# Patient Record
Sex: Female | Born: 1985 | Race: Black or African American | Hispanic: No | Marital: Single | Smoking: Never smoker
Health system: Southern US, Community
[De-identification: ages and names within clinical notes are randomized; demographics above are authoritative.]

## PROBLEM LIST (undated history)

## (undated) DIAGNOSIS — G039 Meningitis, unspecified: Secondary | ICD-10-CM

## (undated) DIAGNOSIS — R569 Unspecified convulsions: Secondary | ICD-10-CM

## (undated) DIAGNOSIS — Z87898 Personal history of other specified conditions: Secondary | ICD-10-CM

## (undated) HISTORY — DX: Personal history of other specified conditions: Z87.898

## (undated) HISTORY — DX: Meningitis, unspecified: G03.9

## (undated) HISTORY — DX: Unspecified convulsions: R56.9

---

## 2013-12-22 ENCOUNTER — Encounter: Payer: Self-pay | Admitting: Neurology

## 2013-12-22 ENCOUNTER — Ambulatory Visit (INDEPENDENT_AMBULATORY_CARE_PROVIDER_SITE_OTHER): Payer: BC Managed Care – PPO | Admitting: Neurology

## 2013-12-22 VITALS — BP 112/74 | HR 75 | Ht 66.0 in | Wt 237.0 lb

## 2013-12-22 DIAGNOSIS — G43909 Migraine, unspecified, not intractable, without status migrainosus: Secondary | ICD-10-CM

## 2013-12-22 DIAGNOSIS — R404 Transient alteration of awareness: Secondary | ICD-10-CM

## 2013-12-22 MED ORDER — RIZATRIPTAN BENZOATE 5 MG PO TBDP: 5.0000 mg | ORAL_TABLET | ORAL | Status: DC | PRN

## 2013-12-22 MED ORDER — TOPIRAMATE 50 MG PO TABS: ORAL_TABLET | ORAL | Status: DC

## 2013-12-22 NOTE — Progress Notes (Signed)
PATIENT: Jessica Rasmussen DOB: [birth year]  HISTORICAL  Jessica Rasmussen is a 28 years old female, referred by her primary care physician Dr. Hyman HopesWebb for evaluation of frequent headaches, and passing out episode.  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  She began to have frequent headaches since high school, only happen occasionally at the beginning, since Summer 2014, she began to have 1-2 headache each week, aleve helps some, her headache usually lasts few hours.    She also has history of occasional passing out, couple times each year, usually happened in sitting position, proceeded by light headness, feeling going to pass out.   Few days ago, she has one episode of passing out, witnessed by her family, proceeded by feeling dizziness, then eyes rolled backwards, seizure like activities, loss of consciousness for few minutes.   REVIEW OF SYSTEMS: Full 14 system review of systems performed and notable only for many years, spinning sensation, headaches, weakness, dizziness, seizure, passing out  ALLERGIES: Not on File  HOME MEDICATIONS: No current outpatient prescriptions on file prior to visit.   No current facility-administered medications on file prior to visit.    PAST MEDICAL HISTORY: Past Medical History  Diagnosis Date  . Meningitis   . H/O syncope   . Seizure     PAST SURGICAL HISTORY: No past surgical history on file.  FAMILY HISTORY: Family History  Problem Relation Age of Onset  . Diabetes Mother   . Diabetes Maternal Aunt   . Diabetes Maternal Grandmother   . Hypertension Mother   . Hypertension Maternal Grandmother     SOCIAL HISTORY:  History   Social History  . Marital Status: Single    Spouse Name: N/A    Number of Children: 2  . Years of Education: HS    Occupational History  .  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    Social History Main Topics  . Smoking status: Never Smoker   . Smokeless tobacco: Never Used  . Alcohol Use: No  . Drug Use: No  . Sexual Activity: Not on file   Other Topics Concern  . Not on file   Social History Narrative   Patient is single.   Patient has two children.   Patient has a high school education.   Patient works full-time.   Patient does not  drink caffeine.   Patient is right-handed.     PHYSICAL EXAM   Filed Vitals:   12/22/13 1605  BP: 112/74  Pulse: 75  Height: 5\' 6"  (1.676 m)  Weight: 237 lb (107.502 kg)    Not recorded    Body mass index is 38.27 kg/(m^2).   Generalized: In no acute distress  Neck: Supple, no carotid bruits   Cardiac: Regular rate rhythm  Pulmonary: Clear to auscultation bilaterally  Musculoskeletal: No deformity  Neurological examination  Mentation: Alert oriented to time, place, history taking, and causual conversation  Cranial nerve II-XII: Pupils were equal round reactive to light. Extraocular movements were full.  Visual field were full on confrontational test. Bilateral fundi were sharp.  Facial sensation and strength were normal. Hearing was intact to finger rubbing bilaterally. Uvula tongue midline.  Head turning and shoulder shrug and were normal and symmetric.Tongue protrusion into cheek strength was normal.  Motor: Normal tone, bulk and strength.  Sensory: Intact to  fine touch, pinprick, preserved vibratory sensation, and proprioception at toes.  Coordination: Normal finger to nose, heel-to-shin bilaterally there was no truncal ataxia  Gait: Rising up from seated position without assistance, normal stance, without trunk ataxia, moderate stride, good arm swing, smooth turning, able to perform tiptoe, and heel walking without difficulty.   Romberg signs: Negative  Deep tendon reflexes: Brachioradialis 2/2, biceps 2/2, triceps 2/2,  patellar 2/2, Achilles 2/2, plantar responses were flexor bilaterally.   DIAGNOSTIC DATA (LABS, IMAGING, TESTING) - I reviewed patient records, labs, notes, testing and imaging myself where available.  ASSESSMENT AND PLAN  Jessica Rasmussen is a 28 y.o. female with past medical history of meningitis, seizure, now presenting with seizure like activity, history of migraines, presenting with small frequent headaches, normal neurological examinations  1. her acute MRI of the brain without contrast, EEG 2. starting Topamax, titrating to 50 mg 2 tablets twice a day 3.  Maxalt as needed for migraine headaches   Levert Feinstein, M.D. Ph.D.  [facility] [address], [address] [place], [place] [address] [phone]

## 2014-01-04 ENCOUNTER — Other Ambulatory Visit: Payer: BC Managed Care – PPO

## 2014-01-04 ENCOUNTER — Other Ambulatory Visit: Payer: BC Managed Care – PPO | Admitting: Radiology

## 2014-02-17 ENCOUNTER — Other Ambulatory Visit: Payer: Self-pay | Admitting: Obstetrics and Gynecology

## 2014-02-17 ENCOUNTER — Other Ambulatory Visit (HOSPITAL_COMMUNITY)
Admission: RE | Admit: 2014-02-17 | Discharge: 2014-02-17 | Disposition: A | Discharge status: Home / Self Care | Payer: BC Managed Care – PPO | Source: Ambulatory Visit | Attending: Obstetrics and Gynecology | Admitting: Obstetrics and Gynecology

## 2014-02-17 DIAGNOSIS — Z01419 Encounter for gynecological examination (general) (routine) without abnormal findings: Secondary | ICD-10-CM | POA: Insufficient documentation

## 2014-03-24 ENCOUNTER — Ambulatory Visit: Payer: BC Managed Care – PPO | Admitting: Neurology

## 2014-06-12 ENCOUNTER — Emergency Department (HOSPITAL_COMMUNITY)
Admission: EM | Admit: 2014-06-12 | Discharge: 2014-06-12 | Disposition: A | Discharge status: Home / Self Care | Payer: BC Managed Care – PPO | Attending: Emergency Medicine | Admitting: Emergency Medicine

## 2014-06-12 ENCOUNTER — Encounter (HOSPITAL_COMMUNITY): Payer: Self-pay | Admitting: Emergency Medicine

## 2014-06-12 DIAGNOSIS — D649 Anemia, unspecified: Secondary | ICD-10-CM | POA: Diagnosis not present

## 2014-06-12 DIAGNOSIS — R51 Headache: Secondary | ICD-10-CM | POA: Insufficient documentation

## 2014-06-12 DIAGNOSIS — R110 Nausea: Secondary | ICD-10-CM | POA: Diagnosis not present

## 2014-06-12 DIAGNOSIS — Z9189 Other specified personal risk factors, not elsewhere classified: Secondary | ICD-10-CM | POA: Diagnosis not present

## 2014-06-12 DIAGNOSIS — Z3202 Encounter for pregnancy test, result negative: Secondary | ICD-10-CM | POA: Insufficient documentation

## 2014-06-12 DIAGNOSIS — G43909 Migraine, unspecified, not intractable, without status migrainosus: Secondary | ICD-10-CM | POA: Insufficient documentation

## 2014-06-12 DIAGNOSIS — Z8661 Personal history of infections of the central nervous system: Secondary | ICD-10-CM | POA: Insufficient documentation

## 2014-06-12 DIAGNOSIS — G43009 Migraine without aura, not intractable, without status migrainosus: Secondary | ICD-10-CM

## 2014-06-12 LAB — BASIC METABOLIC PANEL
ANION GAP: 11 (ref 5–15)
BUN: 9 mg/dL (ref 6–23)
CO2: 23 mEq/L (ref 19–32)
Calcium: 9.1 mg/dL (ref 8.4–10.5)
Chloride: 104 mEq/L (ref 96–112)
Creatinine, Ser: 0.66 mg/dL (ref 0.50–1.10)
Glucose, Bld: 75 mg/dL (ref 70–99)
POTASSIUM: 4 meq/L (ref 3.7–5.3)
Sodium: 138 mEq/L (ref 137–147)

## 2014-06-12 LAB — CBC WITH DIFFERENTIAL/PLATELET
BASOS ABS: 0 10*3/uL (ref 0.0–0.1)
BASOS PCT: 1 % (ref 0–1)
EOS ABS: 0.1 10*3/uL (ref 0.0–0.7)
Eosinophils Relative: 1 % (ref 0–5)
HEMATOCRIT: 36.3 % (ref 36.0–46.0)
HEMOGLOBIN: 11.9 g/dL — AB (ref 12.0–15.0)
LYMPHS PCT: 54 % — AB (ref 12–46)
Lymphs Abs: 2.8 10*3/uL (ref 0.7–4.0)
MCH: 26.7 pg (ref 26.0–34.0)
MCHC: 32.8 g/dL (ref 30.0–36.0)
MCV: 81.4 fL (ref 78.0–100.0)
Monocytes Absolute: 0.4 10*3/uL (ref 0.1–1.0)
Monocytes Relative: 7 % (ref 3–12)
Neutro Abs: 1.9 10*3/uL (ref 1.7–7.7)
Neutrophils Relative %: 37 % — ABNORMAL LOW (ref 43–77)
Platelets: 316 10*3/uL (ref 150–400)
RBC: 4.46 MIL/uL (ref 3.87–5.11)
RDW: 12.6 % (ref 11.5–15.5)
WBC: 5.1 10*3/uL (ref 4.0–10.5)

## 2014-06-12 LAB — POC URINE PREG, ED: Preg Test, Ur: NEGATIVE

## 2014-06-12 MED ORDER — KETOROLAC TROMETHAMINE 30 MG/ML IJ SOLN
30.0000 mg | Freq: Once | INTRAMUSCULAR | Status: AC
  Administered 2014-06-12: 30 mg via INTRAVENOUS
  Filled 2014-06-12: qty 1

## 2014-06-12 MED ORDER — NAPROXEN 500 MG PO TABS: 500.0000 mg | ORAL_TABLET | Freq: Two times a day (BID) | ORAL | Status: DC | PRN

## 2014-06-12 MED ORDER — HYDROCODONE-ACETAMINOPHEN 5-325 MG PO TABS: 1.0000 | ORAL_TABLET | Freq: Four times a day (QID) | ORAL | Status: DC | PRN

## 2014-06-12 MED ORDER — METOCLOPRAMIDE HCL 10 MG PO TABS: 10.0000 mg | ORAL_TABLET | Freq: Four times a day (QID) | ORAL | Status: DC | PRN

## 2014-06-12 MED ORDER — SODIUM CHLORIDE 0.9 % IV BOLUS (SEPSIS)
1000.0000 mL | Freq: Once | INTRAVENOUS | Status: AC
  Administered 2014-06-12: 1000 mL via INTRAVENOUS

## 2014-06-12 MED ORDER — DIPHENHYDRAMINE HCL 25 MG PO TABS: 25.0000 mg | ORAL_TABLET | Freq: Four times a day (QID) | ORAL | Status: DC | PRN

## 2014-06-12 MED ORDER — DEXAMETHASONE SODIUM PHOSPHATE 10 MG/ML IJ SOLN
15.0000 mg | Freq: Once | INTRAMUSCULAR | Status: AC
  Administered 2014-06-12: 15 mg via INTRAVENOUS
  Filled 2014-06-12: qty 2

## 2014-06-12 NOTE — Discharge Instructions (Signed)
Your headache could be a migraine which did not respond to your home Topamax, but did well with Toradol and fluids here. Use naprosyn and norco for pain, but don't drive or operate machinery while taking this medication. Use reglan as directed, taken with benadryl, as needed for headache or nausea. Be aware this medication may make you drowsy as well. Stay very well hydrated, this is important to helping your headaches. Your lab work shows some mild anemia, you'll need to see your regular doctor for this. You need to follow up with neurology for ongoing management and care, see them within one week. If your symptoms worsen or change, return to the ER.    Migraine Headache A migraine headache is very bad, throbbing pain on one or both sides of your head. Talk to your doctor about what things may bring on (trigger) your migraine headaches. HOME CARE  Only take medicines as told by your doctor.  Lie down in a dark, quiet room when you have a migraine.  Keep a journal to find out if certain things bring on migraine headaches. For example, write down:  What you eat and drink.  How much sleep you get.  Any change to your diet or medicines.  Lessen how much alcohol you drink.  Quit smoking if you smoke.  Get enough sleep.  Lessen any stress in your life.  Keep lights dim if bright lights bother you or make your migraines worse. GET HELP RIGHT AWAY IF:   Your migraine becomes really bad.  You have a fever.  You have a stiff neck.  You have trouble seeing.  Your muscles are weak, or you lose muscle control.  You lose your balance or have trouble walking.  You feel like you will pass out (faint), or you pass out.  You have really bad symptoms that are different than your first symptoms. MAKE SURE YOU:   Understand these instructions.  Will watch your condition.  Will get help right away if you are not doing well or get worse. Document Released: 07/15/2008 Document Revised:  12/29/2011 Document Reviewed: 06/13/2013 [facility] Patient Information 2015 [place], [place]. This information is not intended to replace advice given to you by your health care provider. Make sure you discuss any questions you have with your health care provider.  Anemia, Nonspecific Anemia is a condition in which the concentration of red blood cells or hemoglobin in the blood is below normal. Hemoglobin is a substance in red blood cells that carries oxygen to the tissues of the body. Anemia results in not enough oxygen reaching these tissues.  CAUSES  Common causes of anemia include:   Excessive bleeding. Bleeding may be internal or external. This includes excessive bleeding from periods (in women) or from the intestine.   Poor nutrition.   Chronic kidney, thyroid, and liver disease.  Bone marrow disorders that decrease red blood cell production.  Cancer and treatments for cancer.  HIV, AIDS, and their treatments.  Spleen problems that increase red blood cell destruction.  Blood disorders.  Excess destruction of red blood cells due to infection, medicines, and autoimmune disorders. SIGNS AND SYMPTOMS   Minor weakness.   Dizziness.   Headache.  Palpitations.   Shortness of breath, especially with exercise.   Paleness.  Cold sensitivity.  Indigestion.  Nausea.  Difficulty sleeping.  Difficulty concentrating. Symptoms may occur suddenly or they may develop slowly.  DIAGNOSIS  Additional blood tests are often needed. These help your health care provider determine the  best treatment. Your health care provider will check your stool for blood and look for other causes of blood loss.  TREATMENT  Treatment varies depending on the cause of the anemia. Treatment can include:   Supplements of iron, vitamin B12, or folic acid.   Hormone medicines.   A blood transfusion. This may be needed if blood loss is severe.   Hospitalization. This may be needed if there  is significant continual blood loss.   Dietary changes.  Spleen removal. HOME CARE INSTRUCTIONS Keep all follow-up appointments. It often takes many weeks to correct anemia, and having your health care provider check on your condition and your response to treatment is very important. SEEK IMMEDIATE MEDICAL CARE IF:   You develop extreme weakness, shortness of breath, or chest pain.   You become dizzy or have trouble concentrating.  You develop heavy vaginal bleeding.   You develop a rash.   You have bloody or black, tarry stools.   You faint.   You vomit up blood.   You vomit repeatedly.   You have abdominal pain.  You have a fever or persistent symptoms for more than 2-3 days.   You have a fever and your symptoms suddenly get worse.   You are dehydrated.  MAKE SURE YOU:  Understand these instructions.  Will watch your condition.  Will get help right away if you are not doing well or get worse. Document Released: 11/13/2004 Document Revised: 06/08/2013 Document Reviewed: 04/01/2013 [facility] Patient Information 2015 [place], [place]. This information is not intended to replace advice given to you by your health care provider. Make sure you discuss any questions you have with your health care provider.

## 2014-06-12 NOTE — ED Notes (Signed)
Pt reports generalized HA x "months" but states this AM it was so bad it awoke her from sleep. Reports photophobia. Denies vision changes/blurred vision. Denies taking anything for pain. NAD. AO x4. PERRLA, 3mm.

## 2014-06-12 NOTE — ED Provider Notes (Signed)
CSN: [id number]     Arrival date & time 06/12/14  1152 History   First MD Initiated Contact with Patient 06/12/14 1435     Chief Complaint  Patient presents with  . Headache     (Consider location/radiation/quality/duration/timing/severity/associated sxs/prior Treatment) HPI Comments: Jessica Rasmussen is a 28 y.o. female with a PMHx of migraines, remote hx of meningitis as a child (does not recall), and remote hx of seizures, who presents to the ED today with complaints of ongoing migraine x 1 week which became unbearable this morning. States it's generalized, constant, nonradiating, 10/10, pressure-like, worse with leaning forward and coughing/sneezing, and unrelieved with ibuprofen and topamax. States the pain is similar to her migraines, although usually her migraines resolve with medications. Denies thunderclap onset or worst headache of her life. Endorses photophobia, as well as some mild nausea yesterday which is now resolved. Endorses feeling lightheaded when going from sitting to standing. Denies fever, chills, vertigo, syncope, neck pain, neck stiffness, URI symptoms, CP, SOB, cough, abd pain, vomiting, diarrhea/constipation, urinary symptoms, vaginal symptoms, paresthesias, weakness, numbness, facial asymmetry, confusion, vision changes/blurry vision. Denies recent insect bites/tick bites or recent travel. States she doesn't recall if this feels at all like her meningitis because she was very young, but doesn't think this is as bad as that was.   Patient is a 28 y.o. female presenting with migraines. The history is provided by the patient. No language interpreter was used.  Migraine This is a chronic problem. The current episode started in the past 7 days. The problem occurs constantly. The problem has been unchanged. Associated symptoms include headaches and nausea (once yesterday, now resolved). Pertinent negatives include no abdominal pain, arthralgias, change in bowel habit, chest pain,  chills, congestion, coughing, fatigue, fever, joint swelling, myalgias, neck pain, numbness, rash, sore throat, urinary symptoms, vertigo, visual change, vomiting or weakness. The symptoms are aggravated by bending, coughing and exertion. She has tried NSAIDs (Ibuprofen and topamax) for the symptoms. The treatment provided no relief.    Past Medical History  Diagnosis Date  . Meningitis   . H/O syncope   . Seizure    History reviewed. No pertinent past surgical history. Family History  Problem Relation Age of Onset  . Diabetes Mother   . Diabetes Maternal Aunt   . Diabetes Maternal Grandmother   . Hypertension Mother   . Hypertension Maternal Grandmother    History  Substance Use Topics  . Smoking status: Never Smoker   . Smokeless tobacco: Never Used  . Alcohol Use: No   OB History   Grav Para Term Preterm Abortions TAB SAB Ect Mult Living                 Review of Systems  Constitutional: Negative for fever, chills and fatigue.  HENT: Negative for congestion and sore throat.   Eyes: Positive for photophobia. Negative for pain and visual disturbance.  Respiratory: Negative for cough and chest tightness.   Cardiovascular: Negative for chest pain and leg swelling.  Gastrointestinal: Positive for nausea (once yesterday, now resolved). Negative for vomiting, abdominal pain, diarrhea, constipation and change in bowel habit.  Genitourinary: Negative for dysuria, hematuria and decreased urine volume.  Musculoskeletal: Negative for arthralgias, back pain, joint swelling, myalgias, neck pain and neck stiffness.  Skin: Negative for rash.  Neurological: Positive for light-headedness (when going from sitting to standing) and headaches. Negative for dizziness, vertigo, tremors, syncope, facial asymmetry, speech difficulty, weakness and numbness.  Psychiatric/Behavioral: Negative for confusion.  Allergies  Review of patient's allergies indicates no known allergies.  Home  Medications   Prior to Admission medications   Medication Sig Start Date End Date Taking? Authorizing Provider  cyclobenzaprine (FLEXERIL) 10 MG tablet Take 10 mg by mouth daily as needed for muscle spasms.  12/19/13  Yes Historical Provider, MD  ibuprofen (ADVIL,MOTRIN) 800 MG tablet Take 800 mg by mouth every 8 (eight) hours as needed for moderate pain.  12/19/13  Yes Historical Provider, MD  SUMAtriptan (IMITREX) 25 MG tablet Take 25 mg by mouth every 2 (two) hours as needed for migraine.  12/13/13  Yes Historical Provider, MD  diphenhydrAMINE (BENADRYL) 25 MG tablet Take 1 tablet (25 mg total) by mouth every 6 (six) hours as needed (headache). 06/12/14   Jonanthan Bolender Strupp Camprubi-Soms, PA-C  HYDROcodone-acetaminophen (NORCO) 5-325 MG per tablet Take 1-2 tablets by mouth every 6 (six) hours as needed for severe pain. 06/12/14   Damien Batty Strupp Camprubi-Soms, PA-C  metoCLOPramide (REGLAN) 10 MG tablet Take 1 tablet (10 mg total) by mouth every 6 (six) hours as needed for nausea (nausea/headache). 06/12/14   Mahamadou Weltz Strupp Camprubi-Soms, PA-C  naproxen (NAPROSYN) 500 MG tablet Take 1 tablet (500 mg total) by mouth 2 (two) times daily as needed for mild pain, moderate pain or headache (TAKE WITH MEALS.). 06/12/14   Adaleena Mooers Strupp Camprubi-Soms, PA-C   BP 118/82  Pulse 86  Temp(Src) 98.8 F (37.1 C) (Oral)  Resp 18  SpO2 98% Physical Exam  Nursing note and vitals reviewed. Constitutional: She is oriented to person, place, and time. Vital signs are normal. She appears well-developed and well-nourished.  Non-toxic appearance. No distress.  Afebrile, nontoxic, NAD  HENT:  Head: Normocephalic and atraumatic.  Nose: Nose normal.  Mouth/Throat: Uvula is midline and oropharynx is clear and moist. Mucous membranes are dry.  [place]/AT, nose clear, oropharynx clear without uvular deviation or tongue asymmetry with protrusion. Mucous membranes mildly dry  Eyes: Conjunctivae and EOM are normal. Pupils are equal,  round, and reactive to light. Right eye exhibits no discharge. Left eye exhibits no discharge.  EOMI, conjunctiva clear, PERRL, visual fields without deficit  Neck: Normal range of motion. Neck supple. No spinous process tenderness and no muscular tenderness present. No rigidity. Normal range of motion present. No Brudzinski's sign noted.  FROM intact without spinous process or paraspinous muscle TTP, no rigidity or meningeal signs.  Cardiovascular: Normal rate, regular rhythm, normal heart sounds and intact distal pulses.  Exam reveals no gallop and no friction rub.   No murmur heard. RRR, nl s1/s2, no m/r/g, distal pulses equal in all extremities  Pulmonary/Chest: Effort normal and breath sounds normal. No respiratory distress. She has no wheezes. She has no rhonchi. She has no rales.  Abdominal: Soft. Normal appearance and bowel sounds are normal. She exhibits no distension. There is no tenderness. There is no rigidity, no rebound and no guarding.  Musculoskeletal: Normal range of motion.  Moving all extremities well. Gait nonataxic. Strength 5/5 in all extremities, sensation grossly intact in all extremities.  Neurological: She is alert and oriented to person, place, and time. She has normal strength and normal reflexes. No cranial nerve deficit or sensory deficit. She displays a negative Romberg sign. Coordination and gait normal.  A&O x4, CN2-12 grossly intact, sensation grossly intact in all extremities, strength 5/5 in all extremities, neg romberg, neg pronator drift, no cerebellar signs with good coordination on finger-to-nose. Gait nonataxic with tandem walking. DTRs equal and symmetric bilaterally  Skin: Skin is  warm, dry and intact. No rash noted.  No rashes or target lesions  Psychiatric: She has a normal mood and affect. Cognition and memory are normal.    ED Course  Procedures (including critical care time) Labs Review Labs Reviewed  CBC WITH DIFFERENTIAL - Abnormal; Notable for  the following:    Hemoglobin 11.9 (*)    Neutrophils Relative % 37 (*)    Lymphocytes Relative 54 (*)    All other components within normal limits  BASIC METABOLIC PANEL  POC URINE PREG, ED    Imaging Review No results found.   EKG Interpretation None      MDM   Final diagnoses:  Migraine without aura and without status migrainosus, not intractable  Anemia, unspecified anemia type     27y/o female with hx of migraines presenting with ongoing migraine x1 wk which has progressively worsened. Afebrile, nontoxic, neuro exam benign. Pt cannot take any narcotic meds or meds that would make her drowsy due to having to drive home. Will check basic labs, give IVFs and toradol, and re-evaluate. Pt in between neurologists, has MRI scheduled but hasn't met deductible, and wants neuro f/up with different neurologist since she felt that hers didn't evaluate her fully.  4:14 PM [facility] neg. CBC w/diff showing mild anemia which pt states is baseline. BMP WNL. Upreg neg. Pt feels vast improvement of pain now after toradol, would like to go home prior to finishing bolus (states she has a child at home). Will give decadron prior to d/c to avoid rebound HA over next 24hrs. Doubt any emergent intracranial etiology, I believe this is a migraine which has resolved. Pt talking on phone and ambulating with ease. Do not feel imaging would be warranted emergently today. No red flag risk factors or s/sx. Rx given for naprosyn, norco, benadryl and reglan. Discussed hydration and return precautions. Neuro f/up given. I explained the diagnosis and have given explicit precautions to return to the ER including for any other new or worsening symptoms. The patient understands and accepts the medical plan as it's been dictated and I have answered their questions. Discharge instructions concerning home care and prescriptions have been given. The patient is STABLE and is discharged to home in good condition.  BP 118/73   Pulse 73  Temp(Src) 98.8 F (37.1 C) (Oral)  Resp 16  SpO2 98%  Meds ordered this encounter  Medications  . sodium chloride 0.9 % bolus 1,000 mL    Sig:   . ketorolac (TORADOL) 30 MG/ML injection 30 mg    Sig:   . dexamethasone (DECADRON) injection 15 mg    Sig:   . naproxen (NAPROSYN) 500 MG tablet    Sig: Take 1 tablet (500 mg total) by mouth 2 (two) times daily as needed for mild pain, moderate pain or headache (TAKE WITH MEALS.).    Dispense:  20 tablet    Refill:  0    Order Specific Question:  Supervising Provider    Answer:  Noemi Chapel D [[id number]]  . HYDROcodone-acetaminophen (NORCO) 5-325 MG per tablet    Sig: Take 1-2 tablets by mouth every 6 (six) hours as needed for severe pain.    Dispense:  6 tablet    Refill:  0    Order Specific Question:  Supervising Provider    Answer:  Noemi Chapel D [[id number]]  . metoCLOPramide (REGLAN) 10 MG tablet    Sig: Take 1 tablet (10 mg total) by mouth every 6 (six) hours as needed for  nausea (nausea/headache).    Dispense:  8 tablet    Refill:  0    Order Specific Question:  Supervising Provider    Answer:  Noemi Chapel D [[id number]]  . diphenhydrAMINE (BENADRYL) 25 MG tablet    Sig: Take 1 tablet (25 mg total) by mouth every 6 (six) hours as needed (headache).    Dispense:  12 tablet    Refill:  0    Order Specific Question:  Supervising Provider    Answer:  Jessica Rasmussen [address] Camprubi-Soms, PA-C 06/12/14 [phone]

## 2014-06-12 NOTE — ED Provider Notes (Signed)
Medical screening examination/treatment/procedure(s) were performed by non-physician practitioner and as supervising physician I was immediately available for consultation/collaboration.   EKG Interpretation None        Gilda Crease, MD 06/12/14 [phone]

## 2018-08-08 ENCOUNTER — Encounter (HOSPITAL_COMMUNITY): Payer: Self-pay | Admitting: Behavioral Health

## 2018-08-08 ENCOUNTER — Inpatient Hospital Stay (HOSPITAL_COMMUNITY)
Admission: RE | Admit: 2018-08-08 | Discharge: 2018-08-14 | DRG: 885 | Disposition: A | Discharge status: Home / Self Care | Payer: 59 | Attending: Psychiatry | Admitting: Psychiatry

## 2018-08-08 ENCOUNTER — Encounter (HOSPITAL_COMMUNITY): Payer: Self-pay | Admitting: Emergency Medicine

## 2018-08-08 ENCOUNTER — Emergency Department (HOSPITAL_COMMUNITY)
Admission: EM | Admit: 2018-08-08 | Discharge: 2018-08-08 | Disposition: A | Discharge status: Home / Self Care | Payer: Self-pay | Attending: Emergency Medicine | Admitting: Emergency Medicine

## 2018-08-08 ENCOUNTER — Other Ambulatory Visit: Payer: Self-pay

## 2018-08-08 ENCOUNTER — Emergency Department (HOSPITAL_COMMUNITY): Payer: Self-pay

## 2018-08-08 DIAGNOSIS — G4700 Insomnia, unspecified: Secondary | ICD-10-CM | POA: Diagnosis present

## 2018-08-08 DIAGNOSIS — R002 Palpitations: Secondary | ICD-10-CM | POA: Insufficient documentation

## 2018-08-08 DIAGNOSIS — Z8249 Family history of ischemic heart disease and other diseases of the circulatory system: Secondary | ICD-10-CM

## 2018-08-08 DIAGNOSIS — F410 Panic disorder [episodic paroxysmal anxiety] without agoraphobia: Secondary | ICD-10-CM | POA: Diagnosis present

## 2018-08-08 DIAGNOSIS — Z79899 Other long term (current) drug therapy: Secondary | ICD-10-CM | POA: Insufficient documentation

## 2018-08-08 DIAGNOSIS — F411 Generalized anxiety disorder: Secondary | ICD-10-CM

## 2018-08-08 DIAGNOSIS — I10 Essential (primary) hypertension: Secondary | ICD-10-CM | POA: Diagnosis present

## 2018-08-08 DIAGNOSIS — F419 Anxiety disorder, unspecified: Secondary | ICD-10-CM | POA: Diagnosis not present

## 2018-08-08 DIAGNOSIS — Z853 Personal history of malignant neoplasm of breast: Secondary | ICD-10-CM | POA: Diagnosis not present

## 2018-08-08 DIAGNOSIS — Z8661 Personal history of infections of the central nervous system: Secondary | ICD-10-CM | POA: Diagnosis not present

## 2018-08-08 DIAGNOSIS — F333 Major depressive disorder, recurrent, severe with psychotic symptoms: Secondary | ICD-10-CM | POA: Diagnosis present

## 2018-08-08 DIAGNOSIS — Z833 Family history of diabetes mellitus: Secondary | ICD-10-CM | POA: Diagnosis not present

## 2018-08-08 LAB — BASIC METABOLIC PANEL
Anion gap: 8 (ref 5–15)
BUN: 8 mg/dL (ref 6–20)
CALCIUM: 9.4 mg/dL (ref 8.9–10.3)
CO2: 25 mmol/L (ref 22–32)
CREATININE: 0.68 mg/dL (ref 0.44–1.00)
Chloride: 108 mmol/L (ref 98–111)
GFR calc Af Amer: >60 mL/min (ref >60)
GFR calc non Af Amer: >60 mL/min (ref >60)
Glucose, Bld: 100 mg/dL — ABNORMAL HIGH (ref 70–99)
Potassium: 3.8 mmol/L (ref 3.5–5.1)
SODIUM: 141 mmol/L (ref 135–145)

## 2018-08-08 LAB — CBC WITH DIFFERENTIAL/PLATELET
ABS IMMATURE GRANULOCYTES: 0.01 10*3/uL (ref 0.00–0.07)
BASOS PCT: 1 %
Basophils Absolute: 0 10*3/uL (ref 0.0–0.1)
EOS PCT: 2 %
Eosinophils Absolute: 0.1 10*3/uL (ref 0.0–0.5)
HCT: 38.7 % (ref 36.0–46.0)
Hemoglobin: 12.1 g/dL (ref 12.0–15.0)
Immature Granulocytes: 0 %
Lymphocytes Relative: 33 %
Lymphs Abs: 1.9 10*3/uL (ref 0.7–4.0)
MCH: 27.1 pg (ref 26.0–34.0)
MCHC: 31.3 g/dL (ref 30.0–36.0)
MCV: 86.8 fL (ref 80.0–100.0)
Monocytes Absolute: 0.4 10*3/uL (ref 0.1–1.0)
Monocytes Relative: 6 %
NEUTROS ABS: 3.4 10*3/uL (ref 1.7–7.7)
Neutrophils Relative %: 58 %
PLATELETS: 344 10*3/uL (ref 150–400)
RBC: 4.46 MIL/uL (ref 3.87–5.11)
RDW: 12.8 % (ref 11.5–15.5)
WBC: 5.8 10*3/uL (ref 4.0–10.5)
nRBC: 0 % (ref 0.0–0.2)

## 2018-08-08 LAB — TSH: TSH: 1.603 u[IU]/mL (ref 0.350–4.500)

## 2018-08-08 LAB — I-STAT BETA HCG BLOOD, ED (MC, WL, AP ONLY): I-stat hCG, quantitative: <5 m[IU]/mL (ref <5)

## 2018-08-08 LAB — D-DIMER, QUANTITATIVE: D-Dimer, Quant: 0.33 ug/mL-FEU (ref 0.00–0.50)

## 2018-08-08 MED ORDER — LORAZEPAM 0.5 MG PO TABS: 0.5000 mg | ORAL_TABLET | ORAL | Status: DC | PRN

## 2018-08-08 MED ORDER — MAGNESIUM HYDROXIDE 400 MG/5ML PO SUSP: 30.0000 mL | Freq: Every day | ORAL | Status: DC | PRN

## 2018-08-08 MED ORDER — LORAZEPAM 0.5 MG PO TABS
0.5000 mg | ORAL_TABLET | ORAL | Status: DC | PRN
  Administered 2018-08-08 – 2018-08-10 (×3): 0.5 mg via ORAL
  Filled 2018-08-08 (×3): qty 1

## 2018-08-08 MED ORDER — TRAZODONE HCL 50 MG PO TABS
50.0000 mg | ORAL_TABLET | Freq: Every evening | ORAL | Status: DC | PRN
  Administered 2018-08-08 – 2018-08-13 (×6): 50 mg via ORAL
  Filled 2018-08-08 (×5): qty 1

## 2018-08-08 MED ORDER — ALUM & MAG HYDROXIDE-SIMETH 200-200-20 MG/5ML PO SUSP: 30.0000 mL | ORAL | Status: DC | PRN

## 2018-08-08 MED ORDER — HYDROXYZINE HCL 25 MG PO TABS: 25.0000 mg | ORAL_TABLET | Freq: Three times a day (TID) | ORAL | Status: DC | PRN

## 2018-08-08 MED ORDER — SERTRALINE HCL 50 MG PO TABS
50.0000 mg | ORAL_TABLET | Freq: Every day | ORAL | Status: DC
  Administered 2018-08-09 – 2018-08-12 (×4): 50 mg via ORAL
  Filled 2018-08-08 (×5): qty 1

## 2018-08-08 MED ORDER — ACETAMINOPHEN 325 MG PO TABS
650.0000 mg | ORAL_TABLET | Freq: Four times a day (QID) | ORAL | Status: DC | PRN
  Administered 2018-08-09 – 2018-08-11 (×5): 650 mg via ORAL
  Filled 2018-08-08 (×5): qty 2

## 2018-08-08 MED ORDER — LORAZEPAM 1 MG PO TABS
1.0000 mg | ORAL_TABLET | Freq: Once | ORAL | Status: AC
  Administered 2018-08-08: 1 mg via ORAL
  Filled 2018-08-08: qty 1

## 2018-08-08 NOTE — H&P (Addendum)
Psychiatric Admission Assessment Adult  Patient Identification: Jessica Rasmussen MRN:  [id number] Date of Evaluation:  08/08/2018 Chief Complaint:  " I need someone to help me" Principal Diagnosis:  MDD, consider Panic Disorder Diagnosis:   Patient Active Problem List   Diagnosis Date Noted  . MDD (major depressive disorder), recurrent, severe, with psychosis ([place]) [F33.3] 08/08/2018  . Migraine, unspecified, without mention of intractable migraine without mention of status migrainosus [G43.909] 12/22/2013  . Alteration of consciousness [R40.4] 12/22/2013   History of Present Illness: Patient is a 32 year old female. Presented to ED earlier today with panic symptoms. Describes " my heart was beating really fast ,I felt it was coming out of my chest. I felt chills and was shaky ". States she has recently been fearful, anxious, with thoughts that " something is going to happen to me, that I am going crazy, that I am going to die". Describe a subjective feeling of dissociation, described as " like I know I am here and this is real, but it seems like it is not real". Reports " before this I had some anxiety attacks, but not this bad". States symptoms worsened last night, and reports she hoped  they would improve once she got some sleep , but continued this AM. States she was discharged from ED but  spoke with a friend and decided to come to [facility].   In addition to anxiety symptoms as above, also reports depression, neuro-vegetative symptoms of depression.  Reports these symptoms have been present for about two to three weeks, but states she has been depressed since a break up in April 2019. She also reports a friend of hers passed away from cancer early this month. Denies suicidal ideations.  Associated Signs/Symptoms: Depression Symptoms:  depressed mood, anhedonia, insomnia, anxiety, panic attacks, loss of energy/fatigue, decreased appetite, (Hypo) Manic Symptoms:  Subjective feelings of  racing thoughts  Anxiety Symptoms:  Reports increased anxiety recently, and describes panic symptoms as above  Psychotic Symptoms:  Does not endorse hallucinations, no delusions expressed . States  " I do think I have been thinking too much, like I am confused, I am thinking in circles".  PTSD Symptoms: Does not endorse  Total Time spent with patient: 45 minutes  Past Psychiatric History: no history of prior psychiatric admissions, no history of suicide attempts, denies history of self cutting.  Denies prior history of severe depressive episodes, denies history of mania, denies history of psychosis. 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 States she has never been treated with psychiatric medications. Denies history of violence  Is the patient at risk to self? No.  Has the patient been a risk to self in the past 6 months? No.  Has the patient been a risk to self within the distant past? No.  Is the patient a risk to others? No.  Has the patient been a risk to others in the past 6 months? No.  Has the patient been a risk to others within the distant past? No.   Prior Inpatient Therapy: Prior Inpatient Therapy: No Prior Outpatient Therapy: Prior Outpatient Therapy: No Does patient have an ACCT team?: No Does patient have Intensive In-House Services?  : No Does patient have [facility] services? : No Does patient have P4CC services?: No  Alcohol Screening: 1. How often do you have a drink containing alcohol?: Monthly or less 2. How many drinks containing alcohol do you  have on a typical day when you are drinking?: 1 or 2 3. How often do you have six or more drinks on one occasion?: Never AUDIT-C Score: 1 4. How often during the last year have you found that you were not able to stop drinking once you had started?: Never 5. How often during the last year have you failed to do  what was normally expected from you becasue of drinking?: Never 6. How often during the last year have you needed a first drink in the morning to get yourself going after a heavy drinking session?: Never 7. How often during the last year have you had a feeling of guilt of remorse after drinking?: Never 8. How often during the last year have you been unable to remember what happened the night before because you had been drinking?: Never 9. Have you or someone else been injured as a result of your drinking?: No 10. Has a relative or friend or a doctor or another health worker been concerned about your drinking or suggested you cut down?: No Alcohol Use Disorder Identification Test Final Score (AUDIT): 1 Intervention/Follow-up: AUDIT Score <7 follow-up not indicated Substance Abuse History in the last 12 months:  Denies alcohol or drug abuse  Consequences of Substance Abuse: Denies  Previous Psychotropic Medications: reports she has never been treated with psychiatric medications .  Psychological Evaluations:  No  Past Medical History: reports history of seizures, which she describes as grand mal, states last one was several years ago. States she has not been on anti-seizure medications for about 5 years . Reports she was not taking any medications prior to admission. Denies other medical illnesses. NKDA.  Past Medical History:  Diagnosis Date  . H/O syncope   . Meningitis   . Seizure [facility])    History reviewed. No pertinent surgical history. Family History: father passed away ( murdered in 15-Jan-2000), mother alive, states she has a distant relationship with her. Has 6 siblings . Family History  Problem Relation Age of Onset  . Diabetes Mother   . Hypertension Mother   . Diabetes Maternal Aunt   . Diabetes Maternal Grandmother   . Hypertension Maternal Grandmother    Family Psychiatric  History:  Denies history of psychiatric illness or of suicides in family. Reports a sister and mother have  history of alcohol abuse  Tobacco Screening: does not smoke or vape  Social History: 13, single, has two children ages 21,6. Currently they are with a family friend. Employed at the [employer] . Denies legal issues .  Social History   Substance and Sexual Activity  Alcohol Use Yes  . Alcohol/week: 1.0 standard drinks  . Types: 1 Glasses of wine per week   Comment: once every couple months     Social History   Substance and Sexual Activity  Drug Use No   Comment: Pt denied; use of THC as a teenager    Additional Social History: Marital status: Single    Pain Medications: See MAR Prescriptions: See MAR Over the Counter: See MAR History of alcohol / drug use?: No history of alcohol / drug abuse(Denied any significant use)   Allergies:  No Known Allergies Lab Results:  Results for orders placed or performed during the hospital encounter of 08/08/18 (from the past 48 hour(s))  CBC with Differential     Status: None   Collection Time: 08/08/18  8:10 AM  Result Value Ref Range   WBC 5.8 4.0 - 10.5 K/uL  RBC 4.46 3.87 - 5.11 MIL/uL   Hemoglobin 12.1 12.0 - 15.0 g/dL   HCT 38.7 36.0 - 46.0 %   MCV 86.8 80.0 - 100.0 fL   MCH 27.1 26.0 - 34.0 pg   MCHC 31.3 30.0 - 36.0 g/dL   RDW 12.8 11.5 - 15.5 %   Platelets 344 150 - 400 K/uL   nRBC 0.0 0.0 - 0.2 %   Neutrophils Relative % 58 %   Neutro Abs 3.4 1.7 - 7.7 K/uL   Lymphocytes Relative 33 %   Lymphs Abs 1.9 0.7 - 4.0 K/uL   Monocytes Relative 6 %   Monocytes Absolute 0.4 0.1 - 1.0 K/uL   Eosinophils Relative 2 %   Eosinophils Absolute 0.1 0.0 - 0.5 K/uL   Basophils Relative 1 %   Basophils Absolute 0.0 0.0 - 0.1 K/uL   Immature Granulocytes 0 %   Abs Immature Granulocytes 0.01 0.00 - 0.07 K/uL    Comment: Performed at [facility], [place] [address] [place], [place] [address]  Basic metabolic panel     Status: Abnormal   Collection Time: 08/08/18  8:10 AM  Result Value Ref Range   Sodium 141 135 -  145 mmol/L   Potassium 3.8 3.5 - 5.1 mmol/L   Chloride 108 98 - 111 mmol/L   CO2 25 22 - 32 mmol/L   Glucose, Bld 100 (H) 70 - 99 mg/dL   BUN 8 6 - 20 mg/dL   Creatinine, Ser 0.68 0.44 - 1.00 mg/dL   Calcium 9.4 8.9 - 10.3 mg/dL   GFR calc non Af Amer >60 >60 mL/min   GFR calc Af Amer >60 >60 mL/min    Comment: (NOTE) The eGFR has been calculated using the CKD EPI equation. This calculation has not been validated in all clinical situations. eGFR's persistently <60 mL/min signify possible Chronic Kidney Disease.    Anion gap 8 5 - 15    Comment: Performed at [facility], [place] [address] [place], [place] [address]  TSH     Status: None   Collection Time: 08/08/18  8:10 AM  Result Value Ref Range   TSH 1.603 0.350 - 4.500 uIU/mL    Comment: Performed by a 3rd Generation assay with a functional sensitivity of <=0.01 uIU/mL. Performed at [facility], [place] [address] [place], [place] [address]   D-dimer, quantitative (not at [facility])     Status: None   Collection Time: 08/08/18  8:10 AM  Result Value Ref Range   D-Dimer, Quant 0.33 0.00 - 0.50 ug/mL-FEU    Comment: (NOTE) At the manufacturer cut-off of 0.50 ug/mL FEU, this assay has been documented to exclude PE with a sensitivity and negative predictive value of 97 to 99%.  At this time, this assay has not been approved by the FDA to exclude DVT/VTE. Results should be correlated with clinical presentation. Performed at [facility], [place] [address] [place], [place] [address]   I-Stat Beta hCG blood, ED ([facility], [facility], [facility] only)     Status: None   Collection Time: 08/08/18  8:28 AM  Result Value Ref Range   I-stat hCG, quantitative <5.0 <5 mIU/mL   Comment 3            Comment:   GEST. AGE      CONC.  (mIU/mL)   <=1 WEEK        5 - 50     2 WEEKS  50 - 500     3 WEEKS       100 - 10,000     4 WEEKS     1,000 - 30,000        FEMALE AND NON-PREGNANT FEMALE:     LESS THAN 5  mIU/mL     Blood Alcohol level:  No results found for: [facility]  Metabolic Disorder Labs:  No results found for: HGBA1C, MPG No results found for: PROLACTIN No results found for: CHOL, TRIG, HDL, CHOLHDL, VLDL, LDLCALC  Current Medications: Current Facility-Administered Medications  Medication Dose Route Frequency Provider Last Rate Last Dose  . acetaminophen (TYLENOL) tablet 650 mg  650 mg Oral Q6H PRN Suella Broad, FNP      . alum & mag hydroxide-simeth (MAALOX/MYLANTA) 200-200-20 MG/5ML suspension 30 mL  30 mL Oral Q4H PRN Starkes-Perry, Gayland Curry, FNP      . magnesium hydroxide (MILK OF MAGNESIA) suspension 30 mL  30 mL Oral Daily PRN Starkes-Perry, Gayland Curry, FNP       PTA Medications: Medications Prior to Admission  Medication Sig Dispense Refill Last Dose  . cyclobenzaprine (FLEXERIL) 10 MG tablet Take 10 mg by mouth daily as needed for muscle spasms.    Past Week at Unknown time  . diphenhydrAMINE (BENADRYL) 25 MG tablet Take 1 tablet (25 mg total) by mouth every 6 (six) hours as needed (headache). 12 tablet 0   . HYDROcodone-acetaminophen (NORCO) 5-325 MG per tablet Take 1-2 tablets by mouth every 6 (six) hours as needed for severe pain. 6 tablet 0   . ibuprofen (ADVIL,MOTRIN) 800 MG tablet Take 800 mg by mouth every 8 (eight) hours as needed for moderate pain.    Past Week at Unknown time  . metoCLOPramide (REGLAN) 10 MG tablet Take 1 tablet (10 mg total) by mouth every 6 (six) hours as needed for nausea (nausea/headache). 8 tablet 0   . naproxen (NAPROSYN) 500 MG tablet Take 1 tablet (500 mg total) by mouth 2 (two) times daily as needed for mild pain, moderate pain or headache (TAKE WITH MEALS.). 20 tablet 0   . SUMAtriptan (IMITREX) 25 MG tablet Take 25 mg by mouth every 2 (two) hours as needed for migraine.    Past Week at Unknown time    Musculoskeletal: Strength & Muscle Tone: within normal limits Gait & Station: normal Patient leans: N/A  Psychiatric Specialty  Exam: Physical Exam  Review of Systems  Constitutional: Negative.   HENT: Negative.   Eyes: Negative.   Respiratory: Negative.   Cardiovascular: Positive for palpitations.       Reports palpitations earlier today   Gastrointestinal: Positive for diarrhea and nausea. Negative for vomiting.  Genitourinary: Negative.   Musculoskeletal: Negative.   Skin: Negative.   Neurological: Positive for seizures.       Reports isolated seizure years ago, last time several years ago  Endo/Heme/Allergies: Negative.   Psychiatric/Behavioral: Positive for depression. The patient is nervous/anxious.   All other systems reviewed and are negative.   Blood pressure 137/88, pulse 88, temperature 98.7 F (37.1 C), temperature source Oral, resp. rate 17, height '5\' 5"'$  (1.651 m), weight 95.3 kg, last menstrual period 07/31/2018.Body mass index is 34.95 kg/m.  General Appearance: Well Groomed  Eye Contact:  Good  Speech:  Normal Rate  Volume:  Normal  Mood:  Anxious and Depressed  Affect:  Congruent  Thought Process:  Overall goal directed, tends to become vaguely  tangential at times   Orientation:  Other:  she is alert and attentive , oriented x 3.   Of note, MMSE today 28/30  Thought Content:  no hallucinations, no delusions expressed, does not appear internally preoccupied   Suicidal Thoughts:  No- denies suicidal or self injurious ideations, denies homicidal or violent ideations  Homicidal Thoughts:  No  Memory:  3/3 immediate, 2/3 at 5 minutes .  Judgement:  Fair  Insight:  Fair  Psychomotor Activity:  Normal- no current psychomotor agitation or restlessness   Concentration:  Concentration: Fair and Attention Span: Fair  Recall:  Good  Fund of Knowledge:  Good  Language:  Good  Akathisia:  Negative  Handed:  Right  AIMS (if indicated):     Assets:  Desire for Improvement Resilience  ADL's:  Intact  Cognition:  WNL  Sleep:       Treatment Plan Summary: Daily contact with patient to  assess and evaluate symptoms and progress in treatment, Medication management, Plan inpatient treatment and medications as below  Observation Level/Precautions:  15 minute checks  Laboratory:  as needed   Psychotherapy:  Milieu, group therapy  Medications:   We reviewed medication options- We discussed medication options- agrees to SSRI trial to address anxiety and depression. Start Zoloft 50 mgrs QDAY. Short term management with BZD warranted due to severity of her anxiety Start Ativan 0.5 mgrs Q 4 hours PRN for anxiety as needed .   Consultations:  As needed   Discharge Concerns:  -  Estimated LOS: 4  Days   Other:     Physician Treatment Plan for Primary Diagnosis:  MDD, no psychotic features  Long Term Goal(s): Improvement in symptoms so as ready for discharge  Short Term Goals: Ability to identify changes in lifestyle to reduce recurrence of condition will improve and Ability to maintain clinical measurements within normal limits will improve  Physician Treatment Plan for Secondary Diagnosis: Consider Panic Disorder with Agoraphobia Long Term Goal(s): Improvement in symptoms so as ready for discharge  Short Term Goals: Ability to identify changes in lifestyle to reduce recurrence of condition will improve, Ability to verbalize feelings will improve, Ability to disclose and discuss suicidal ideas, Ability to demonstrate self-control will improve, Ability to identify and develop effective coping behaviors will improve and Ability to maintain clinical measurements within normal limits will improve  I certify that inpatient services furnished can reasonably be expected to improve the patient's condition.    Jenne Campus, MD 10/20/20193:36 PM
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
## 2018-08-08 NOTE — ED Notes (Signed)
Patient transported to X-ray 

## 2018-08-08 NOTE — ED Triage Notes (Addendum)
Pt c/o tachycardia and anxiety since last night. Pt states she has had diarrhea and also feelings of anxiety r/t tachycardia. Denies drug use / alcohol use.

## 2018-08-08 NOTE — ED Provider Notes (Signed)
[facility]-EMERGENCY DEPT Provider Note   CSN: [id number] Arrival date & time: 08/08/18  0744     History   Chief Complaint Chief Complaint  Patient presents with  . Tachycardia  . Anxiety    HPI Jessica Rasmussen is a 32 y.o. female.  The history is provided by the patient.  Shortness of Breath  This is a new problem. The average episode lasts 2 days. The problem occurs continuously.The current episode started 12 to 24 hours ago. The problem has not changed since onset.Pertinent negatives include no fever, no headaches, no coryza, no rhinorrhea, no sore throat, no swollen glands, no ear pain, no neck pain, no cough, no sputum production, no hemoptysis, no wheezing, no PND, no orthopnea, no chest pain, no syncope, no vomiting, no abdominal pain, no rash, no leg pain, no leg swelling and no claudication. It is unknown what precipitated the problem. She has tried nothing for the symptoms. The treatment provided mild relief. Associated medical issues do not include PE.    Past Medical History:  Diagnosis Date  . H/O syncope   . Meningitis   . Seizure [facility])     Patient Active Problem List   Diagnosis Date Noted  . Migraine, unspecified, without mention of intractable migraine without mention of status migrainosus 12/22/2013  . Alteration of consciousness 12/22/2013    History reviewed. No pertinent surgical history.   OB History   None      Home Medications    Prior to Admission medications   Medication Sig Start Date End Date Taking? Authorizing Provider  cyclobenzaprine (FLEXERIL) 10 MG tablet Take 10 mg by mouth daily as needed for muscle spasms.  12/19/13   [provider]  diphenhydrAMINE (BENADRYL) 25 MG tablet Take 1 tablet (25 mg total) by mouth every 6 (six) hours as needed (headache). 06/12/14   Street, [place], PA-C  HYDROcodone-acetaminophen (NORCO) 5-325 MG per tablet Take 1-2 tablets by mouth every 6 (six) hours as needed for  severe pain. 06/12/14   Street, [place], PA-C  ibuprofen (ADVIL,MOTRIN) 800 MG tablet Take 800 mg by mouth every 8 (eight) hours as needed for moderate pain.  12/19/13   [provider]  metoCLOPramide (REGLAN) 10 MG tablet Take 1 tablet (10 mg total) by mouth every 6 (six) hours as needed for nausea (nausea/headache). 06/12/14   Street, [place], PA-C  naproxen (NAPROSYN) 500 MG tablet Take 1 tablet (500 mg total) by mouth 2 (two) times daily as needed for mild pain, moderate pain or headache (TAKE WITH MEALS.). 06/12/14   Street, [place], PA-C  SUMAtriptan (IMITREX) 25 MG tablet Take 25 mg by mouth every 2 (two) hours as needed for migraine.  12/13/13   [provider]    Family History Family History  Problem Relation Age of Onset  . Diabetes Mother   . Hypertension Mother   . Diabetes Maternal Aunt   . Diabetes Maternal Grandmother   . Hypertension Maternal Grandmother     Social History  Social History   Tobacco Use  . Smoking status: Never Smoker  . Smokeless tobacco: Never Used  Substance Use Topics  . Alcohol use: No  . Drug use: No     Allergies   Patient has no known allergies.   Review of Systems Review of Systems  Constitutional: Negative for chills and fever.  HENT: Negative for ear pain, rhinorrhea and sore throat.   Eyes: Negative for pain and visual disturbance.  Respiratory: Positive for shortness of  breath. Negative for cough, hemoptysis, sputum production and wheezing.   Cardiovascular: Negative for chest pain, palpitations, orthopnea, claudication, leg swelling, syncope and PND.  Gastrointestinal: Negative for abdominal pain and vomiting.  Genitourinary: Negative for dysuria and hematuria.  Musculoskeletal: Negative for arthralgias, back pain and neck pain.  Skin: Negative for color change and rash.  Neurological: Negative for seizures, syncope and headaches.  Psychiatric/Behavioral: The patient is nervous/anxious.   All other systems  reviewed and are negative.    Physical Exam Updated Vital Signs  ED Triage Vitals  Enc Vitals Group     BP 08/08/18 0801 (!) 155/119     Pulse Rate 08/08/18 0801 97     Resp 08/08/18 0801 16     Temp 08/08/18 0801 98.8 F (37.1 C)     Temp Source 08/08/18 0801 Oral     SpO2 08/08/18 0801 100 %     Weight 08/08/18 0802 225 lb (102.1 kg)     Height --      Head Circumference --      Peak Flow --      Pain Score 08/08/18 0801 0     Pain Loc --      Pain Edu? --      Excl. in GC? --     Physical Exam  Constitutional: She appears well-developed and well-nourished. No distress.  HENT:  Head: Normocephalic and atraumatic.  Eyes: Pupils are equal, round, and reactive to light. Conjunctivae and EOM are normal.  Neck: Normal range of motion. Neck supple.  Cardiovascular: Regular rhythm, normal heart sounds and intact distal pulses. Tachycardia present.  No murmur heard. Pulmonary/Chest: Effort normal and breath sounds normal. No respiratory distress.  Abdominal: Soft. There is no tenderness.  Musculoskeletal: Normal range of motion. She exhibits no edema.  Neurological: She is alert. She has normal strength. No cranial nerve deficit or sensory deficit.  Skin: Skin is warm and dry. Capillary refill takes less than 2 seconds.  Psychiatric: Her mood appears anxious (mild). She expresses no homicidal and no suicidal ideation. She expresses no suicidal plans and no homicidal plans.  Nursing note and vitals reviewed.    ED Treatments / Results  Labs (all labs ordered are listed, but only abnormal results are displayed) Labs Reviewed  BASIC METABOLIC PANEL - Abnormal; Notable for the following components:      Result Value   Glucose, Bld 100 (*)    All other components within normal limits  CBC WITH DIFFERENTIAL/PLATELET  D-DIMER, QUANTITATIVE (NOT AT [facility])  TSH  I-STAT BETA HCG BLOOD, ED ([facility], [facility], [facility] ONLY)    EKG EKG Interpretation  Date/Time:  Sunday August 08 2018  08:14:33 EDT Ventricular Rate:  83 PR Interval:    QRS Duration: 87 QT Interval:  352 QTC Calculation: 414 R Axis:   78 Text Interpretation:  Sinus rhythm Abnormal Q suggests anterior infarct Confirmed by Virgina Norfolk [phone]) on 08/08/2018 8:41:30 AM   Radiology Dg Chest 2 View  Result Date: 08/08/2018 CLINICAL DATA:  32 year old female with a history of shortness of breath EXAM: CHEST - 2 VIEW COMPARISON:  None. FINDINGS: The heart size and mediastinal contours are within normal limits. Both lungs are clear. The visualized skeletal structures are unremarkable. IMPRESSION: No radiographic evidence of acute cardiopulmonary disease Electronically Signed   By: Gilmer Mor D.O.   On: 08/08/2018 09:05    Procedures Procedures (including critical care time)  Medications Ordered in ED Medications  LORazepam (ATIVAN) tablet 1 mg (1  mg Oral Given 08/08/18 1610)     Initial Impression / Assessment and Plan / ED Course  I have reviewed the triage vital signs and the nursing notes.  Pertinent labs & imaging results that were available during my care of the patient were reviewed by me and considered in my medical decision making (see chart for details).     Tatianna Delgrande is a 32 year old female with no significant medical history who presents to the ED with palpitations, anxiety.  Patient also with some shortness of breath.  Patient with unremarkable vitals.  No fever.  Patient with symptoms that started last night.  She feels very anxious and nervous about her heart beating fast.  She states multiple life stressors at this time.  Denies any suicidal homicidal ideation.  Is tachycardic but has no other DVT or PE risk factors.  Will evaluate with a d-dimer.  EKG shows sinus rhythm.  No ischemic changes.  No chest pain doubt ACS.  Patient with overall unremarkable exam.  She does appear mildly anxious.  Will treat with Ativan and get basic labs to rule out anemia, electrolyte abnormality.   Will obtain chest x-ray to rule out any infectious process, pneumothorax.  Chest x-ray shows no pneumothorax, no pleural effusion, no pneumonia.  Patient with negative d-dimer.  Patient with no significant electrolyte abnormality, anemia.  Symptoms improved with Ativan.  Given information to follow-up with mental health provider and with primary care doctor.  TSH is also order that can be followed up with primary care doctor.  Patient discharged in  good condition and given return precautions.  This chart was dictated using voice recognition software.  Despite best efforts to proofread,  errors can occur which can change the documentation meaning.   Final Clinical Impressions(s) / ED Diagnoses   Final diagnoses:  Palpitations    ED Discharge Orders    None       Virgina Norfolk, DO 08/08/18 [id number]
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
## 2018-08-08 NOTE — Tx Team (Signed)
Initial Treatment Plan 08/08/2018 3:00 PM Jaycelynn Maciolek [id number]    PATIENT STRESSORS: Loss of close friend to cancer   PATIENT STRENGTHS: Ability for insight Metallurgist fund of knowledge Motivation for treatment/growth Physical Health   PATIENT IDENTIFIED PROBLEMS: 1. "My mental health"  2. "My coping because I suppress stuff"                   DISCHARGE CRITERIA:  Improved stabilization in mood, thinking, and/or behavior  PRELIMINARY DISCHARGE PLAN: Outpatient therapy  PATIENT/FAMILY INVOLVEMENT: This treatment plan has been presented to and reviewed with the patient, Tonnia Arvizu.  The patient has been given the opportunity to ask questions and make suggestions.  Kirstie Mirza, RN 08/08/2018, 3:00 PM
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
## 2018-08-08 NOTE — BHH Suicide Risk Assessment (Signed)
[facility] Admission Suicide Risk Assessment   Nursing information obtained from:  Patient Demographic factors:  NA Current Mental Status:  NA Loss Factors:  NA Historical Factors:  NA Risk Reduction Factors:  Responsible for children under 30 years of age, Employed  Total Time spent with patient: 45 minutes Principal Problem:  MDD, Consider Panic Disorder  Diagnosis:   Patient Active Problem List   Diagnosis Date Noted  . MDD (major depressive disorder), recurrent, severe, with psychosis (HCC) [F33.3] 08/08/2018  . Migraine, unspecified, without mention of intractable migraine without mention of status migrainosus [G43.909] 12/22/2013  . Alteration of consciousness [R40.4] 12/22/2013   Subjective Data:   Continued Clinical Symptoms:  Alcohol Use Disorder Identification Test Final Score (AUDIT): 1 The "Alcohol Use Disorders Identification Test", Guidelines for Use in Primary Care, Second Edition.  World Science writer [facility]). Score between 0-7:  no or low risk or alcohol related problems. Score between 8-15:  moderate risk of alcohol related problems. Score between 16-19:  high risk of alcohol related problems. Score 20 or above:  warrants further diagnostic evaluation for alcohol dependence and treatment.   CLINICAL FACTORS:  32 year old female, has two children who are currently with family friend, employed. Presented due to increased anxiety/panic symptoms, described as severe ( palpitations, anxiety, fear of dying or going crazy, feeling tremulous, subjective feelings of unreality). Endorses feeling depressed over the last several weeks following a traumatic break up and recent death of a friend. Denies suicidal ideations. Was not taking any psychiatric medications prior to admission. Denies alcohol or drug abuse ( no UDS or BAL )    Psychiatric Specialty Exam: Physical Exam  ROS  Blood pressure 137/88, pulse 88, temperature 98.7 F (37.1 C), temperature source Oral, resp.  rate 17, height 5\' 5"  (1.651 m), weight 95.3 kg, last menstrual period 07/31/2018.Body mass index is 34.95 kg/m.  See admit note MSE   COGNITIVE FEATURES THAT CONTRIBUTE TO RISK:  Closed-mindedness and Loss of executive function    SUICIDE RISK:   Moderate:  Frequent suicidal ideation with limited intensity, and duration, some specificity in terms of plans, no associated intent, good self-control, limited dysphoria/symptomatology, some risk factors present, and identifiable protective factors, including available and accessible social support.  PLAN OF CARE: Patient will be admitted to inpatient psychiatric unit for stabilization and safety. Will provide and encourage milieu participation. Provide medication management and maked adjustments as needed.  Will follow daily.    I certify that inpatient services furnished can reasonably be expected to improve the patient's condition.   Craige Cotta, MD 08/08/2018, 5:15 PM

## 2018-08-09 LAB — LIPID PANEL
Cholesterol: 172 mg/dL (ref 0–200)
HDL: 60 mg/dL (ref >40)
LDL CALC: 104 mg/dL — AB (ref 0–99)
Total CHOL/HDL Ratio: 2.9 RATIO
Triglycerides: 41 mg/dL (ref <150)
VLDL: 8 mg/dL (ref 0–40)

## 2018-08-09 LAB — RAPID URINE DRUG SCREEN, HOSP PERFORMED
AMPHETAMINES: NOT DETECTED
Barbiturates: NOT DETECTED
Benzodiazepines: NOT DETECTED
COCAINE: NOT DETECTED
OPIATES: NOT DETECTED
TETRAHYDROCANNABINOL: NOT DETECTED

## 2018-08-09 LAB — TSH: TSH: 1.504 u[IU]/mL (ref 0.350–4.500)

## 2018-08-09 NOTE — Progress Notes (Signed)
Pt did not attend wrap-up group   
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
## 2018-08-09 NOTE — Progress Notes (Signed)
Adult Psychoeducational Group Note  Date:  08/09/2018 Time:  9:34 PM  Group Topic/Focus:  Wrap-Up Group:   The focus of this group is to help patients review their daily goal of treatment and discuss progress on daily workbooks.  Participation Level:  Active  Participation Quality:  Appropriate  Affect:  Appropriate  Cognitive:  Appropriate  Insight: Appropriate  Engagement in Group:  Engaged  Modes of Intervention:  Discussion  Additional Comments:  Pt's goal was to control her thoughts and pt stated she met her goal. Pt stated she had a good day.  Pt rated the day at a 8/10.  Jessica Rasmussen 08/09/2018, 9:34 PM

## 2018-08-09 NOTE — Progress Notes (Signed)
D:  Patient's self inventory sheet, patient sleeps good, no sleep medicine.  Poor appetite, normal energy level, poor concentration.  Rated depression and hopeless 8.  Denied withdrawals.  Denied SI.  Physical problems, dizziness, confusion, concentration.  Denied physical pain.  Goal is not be confused, racing thoughts, concentration.  Plans to talk to MD.  No discharge plans. A:  Medications administered per MD orders.  Emotional support and encouragement given patient. R:  Denied SI and HI, contracts for safety.  Denied A/V hallucinations.  Safety maintained with 15 minute checks.

## 2018-08-09 NOTE — [facility] Group Notes (Signed)
[facility] Group Notes:  (Nursing/MHT/Case Management/Adjunct)  Date:  08/09/2018  Time:  4:15 pm  Type of Therapy:  Psychoeducational Skills  Participation Level:  Active  Participation Quality:  Appropriate  Affect:  Appropriate  Cognitive:  Appropriate  Insight:  Appropriate  Engagement in Group:  Engaged  Modes of Intervention:  Discussion  Summary of Progress/Problems: Patient attended group, listened and responded appropriately.     Earline Mayotte 08/09/2018, 6:30 PM

## 2018-08-09 NOTE — Progress Notes (Signed)
Recreation Therapy Notes  Date: 10.21.19 Time: 0930 Location: [address] Dayroom  Group Topic: Stress Management  Goal Area(s) Addresses:  Patient will verbalize importance of using healthy stress management.  Patient will identify positive emotions associated with healthy stress management.   Intervention: Stress Management  Activity : Meditation.  LRT introduced the stress management technique of meditation.  LRT played a meditation that dealt with impermanence.  Patients were to follow along as the meditation played to engage in the meditation.  Education:  Stress Management, Discharge Planning.   Education Outcome: Acknowledges edcuation/In group clarification offered/Needs additional education  Clinical Observations/Feedback: Pt did not attend group.    Caroll Rancher, LRT/CTRS         Lillia Abed, Annett Boxwell A 08/09/2018 11:29 AM

## 2018-08-09 NOTE — Tx Team (Signed)
Interdisciplinary Treatment and Diagnostic Plan Update  08/09/2018 Time of Session:  Jessica Rasmussen MRN: [id number]  Principal Diagnosis: <principal problem not specified>  Secondary Diagnoses: Active Problems:   MDD (major depressive disorder), recurrent, severe, with psychosis ([place])   Current Medications:  Current Facility-Administered Medications  Medication Dose Route Frequency Provider Last Rate Last Dose  . acetaminophen (TYLENOL) tablet 650 mg  650 mg Oral Q6H PRN Suella Broad, FNP   650 mg at 08/09/18 3833  . alum & mag hydroxide-simeth (MAALOX/MYLANTA) 200-200-20 MG/5ML suspension 30 mL  30 mL Oral Q4H PRN Burt Ek, Gayland Curry, FNP      . LORazepam (ATIVAN) tablet 0.5 mg  0.5 mg Oral Q4H PRN Cobos, Myer Peer, MD   0.5 mg at 08/08/18 2019  . magnesium hydroxide (MILK OF MAGNESIA) suspension 30 mL  30 mL Oral Daily PRN Suella Broad, FNP      . sertraline (ZOLOFT) tablet 50 mg  50 mg Oral Daily Cobos, Myer Peer, MD   50 mg at 08/09/18 0824  . traZODone (DESYREL) tablet 50 mg  50 mg Oral QHS PRN Cobos, Myer Peer, MD   50 mg at 08/08/18 2221   PTA Medications: No medications prior to admission.    Patient Stressors: Loss of close friend to cancer  Patient Strengths: Ability for insight Occupational psychologist fund of knowledge Motivation for treatment/growth Physical Health  Treatment Modalities: Medication Management, Group therapy, Case management,  1 to 1 session with clinician, Psychoeducation, Recreational therapy.   Physician Treatment Plan for Primary Diagnosis: <principal problem not specified> Long Term Goal(s): Improvement in symptoms so as ready for discharge Improvement in symptoms so as ready for discharge   Short Term Goals: Ability to identify changes in lifestyle to reduce recurrence of condition will improve Ability to maintain clinical measurements within normal limits will improve Ability to identify  changes in lifestyle to reduce recurrence of condition will improve Ability to verbalize feelings will improve Ability to disclose and discuss suicidal ideas Ability to demonstrate self-control will improve Ability to identify and develop effective coping behaviors will improve Ability to maintain clinical measurements within normal limits will improve  Medication Management: Evaluate patient's response, side effects, and tolerance of medication regimen.  Therapeutic Interventions: 1 to 1 sessions, Unit Group sessions and Medication administration.  Evaluation of Outcomes: Not Met  Physician Treatment Plan for Secondary Diagnosis: Active Problems:   MDD (major depressive disorder), recurrent, severe, with psychosis ([place])  Long Term Goal(s): Improvement in symptoms so as ready for discharge Improvement in symptoms so as ready for discharge   Short Term Goals: Ability to identify changes in lifestyle to reduce recurrence of condition will improve Ability to maintain clinical measurements within normal limits will improve Ability to identify changes in lifestyle to reduce recurrence of condition will improve Ability to verbalize feelings will improve Ability to disclose and discuss suicidal ideas Ability to demonstrate self-control will improve Ability to identify and develop effective coping behaviors will improve Ability to maintain clinical measurements within normal limits will improve     Medication Management: Evaluate patient's response, side effects, and tolerance of medication regimen.  Therapeutic Interventions: 1 to 1 sessions, Unit Group sessions and Medication administration.  Evaluation of Outcomes: Not Met   RN Treatment Plan for Primary Diagnosis: <principal problem not specified> Long Term Goal(s): Knowledge of disease and therapeutic regimen to maintain health will improve  Short Term Goals: Ability to participate in decision making will improve, Ability to  verbalize feelings will improve, Ability to disclose and discuss suicidal ideas and Ability to identify and develop effective coping behaviors will improve  Medication Management: RN will administer medications as ordered by provider, will assess and evaluate patient's response and provide education to patient for prescribed medication. RN will report any adverse and/or side effects to prescribing provider.  Therapeutic Interventions: 1 on 1 counseling sessions, Psychoeducation, Medication administration, Evaluate responses to treatment, Monitor vital signs and CBGs as ordered, Perform/monitor CIWA, COWS, AIMS and Fall Risk screenings as ordered, Perform wound care treatments as ordered.  Evaluation of Outcomes: Not Met   LCSW Treatment Plan for Primary Diagnosis: <principal problem not specified> Long Term Goal(s): Safe transition to appropriate next level of care at discharge, Engage patient in therapeutic group addressing interpersonal concerns.  Short Term Goals: Engage patient in aftercare planning with referrals and resources  Therapeutic Interventions: Assess for all discharge needs, 1 to 1 time with Social worker, Explore available resources and support systems, Assess for adequacy in community support network, Educate family and significant other(s) on suicide prevention, Complete Psychosocial Assessment, Interpersonal group therapy.  Evaluation of Outcomes: Not Met   Progress in Treatment: Attending groups: Yes. Participating in groups: Yes. Taking medication as prescribed: Yes. Toleration medication: Yes. Family/Significant other contact made: No, will contact:  patient declined consent Patient understands diagnosis: Yes. Discussing patient identified problems/goals with staff: Yes. Medical problems stabilized or resolved: Yes. Denies suicidal/homicidal ideation: Yes. Issues/concerns per patient self-inventory: No. Other:   New problem(s) identified: None   New Short  Term/Long Term Goal(s): medication stabilization, elimination of SI thoughts, development of comprehensive mental wellness plan.    Patient Goals:  Learn coping skills   Discharge Plan or Barriers:  CSW will assess for appropriate referrals and discharge planning.   Reason for Continuation of Hospitalization: Anxiety Depression Medication stabilization  Estimated Length of Stay: 3-5 days   Attendees: Patient: 08/09/2018 8:42 AM  Physician: Dr. Neita Garnet, MD 08/09/2018 8:42 AM  Nursing: Rise Paganini.Raliegh Ip, RN 08/09/2018 8:42 AM  RN Care Manager: Lars Pinks, RN 08/09/2018 8:42 AM  Social Worker: Radonna Ricker, [place] 08/09/2018 8:42 AM  Recreational Therapist: Rhunette Croft 08/09/2018 8:42 AM  Other:  08/09/2018 8:42 AM  Other:  08/09/2018 8:42 AM  Other: 08/09/2018 8:42 AM    Scribe for Treatment Team: Marylee Floras, [place] 08/09/2018 8:42 AM

## 2018-08-09 NOTE — Plan of Care (Signed)
Nurse discussed anxiety, depression, coping skills with patient. 

## 2018-08-09 NOTE — Progress Notes (Signed)
[facility] MD Progress Note  08/09/2018 5:36 PM Jessica Rasmussen  MRN:  [id number] Subjective:  Patient reports feeling " a little better", and describes decreased anxiety, and decreased ruminations about recent episode ( describes recent episode of severe anxiety, panic, with fear of dying or " going crazy"). Denies medication side effects. Denies suicidal ideations. Objective : I have discussed case with treatment team and have met with patient. 32 year old female, has two children who are currently with family friend, employed. Presented due to increased anxiety/panic symptoms, described as severe ( palpitations, anxiety, fear of dying or going crazy, feeling tremulous, subjective feelings of unreality). Endorses feeling depressed over the last several weeks following a traumatic break up and recent death of a friend. Denies suicidal ideations. Was not taking any psychiatric medications prior to admission. Denies alcohol or drug abuse ( no UDS or BAL )   Today patient reports partial improvement, and presents with improving mood , decreased severity of anxiety, decreased ruminations, no longer requiring as much reassurance .  She has been visible on unit, no disruptive or agitated behaviors, pleasant on approach. Denies suicidal ideations. Currently on Zoloft trial and on Ativan PRNs for anxiety . Thus far tolerating medications well . Labs reviewed-lipid panel unremarkable, TSH WNL. Principal Problem: MDD, consider Panic Disorder   Diagnosis:   Patient Active Problem List   Diagnosis Date Noted  . MDD (major depressive disorder), recurrent, severe, with psychosis ([place]) [F33.3] 08/08/2018  . Migraine, unspecified, without mention of intractable migraine without mention of status migrainosus [G43.909] 12/22/2013  . Alteration of consciousness [R40.4] 12/22/2013   Total Time spent with patient: 20 minutes  Past Psychiatric History:   Past Medical History:  Past Medical History:  Diagnosis  Date  . H/O syncope   . Meningitis   . Seizure [facility])    History reviewed. No pertinent surgical history. Family History:  Family History  Problem Relation Age of Onset  . Diabetes Mother   . Hypertension Mother   . Diabetes Maternal Aunt   . Diabetes Maternal Grandmother   . Hypertension Maternal Grandmother    Family Psychiatric  History:  Social History:  Social History   Substance and Sexual Activity  Alcohol Use Yes  . Alcohol/week: 1.0 standard drinks  . Types: 1 Glasses of wine per week   Comment: once every couple months     Social History   Substance and Sexual Activity  Drug Use No   Comment: Pt denied; use of THC as a teenager    Social History   Socioeconomic History  . Marital status: Single    Spouse name: Not on file  . Number of children: 2  . Years of education: HS  . Highest education level: Not on file  Occupational History    Employer: [employer]  Social Needs  . Financial resource strain: Not on file  . Food insecurity:    Worry: Not on file    Inability: Not on file  . Transportation needs:    Medical: Not on file    Non-medical: Not on file  Tobacco Use  . Smoking status: Never Smoker  . Smokeless tobacco: Never Used  Substance and Sexual Activity  . Alcohol use: Yes    Alcohol/week: 1.0 standard drinks    Types: 1 Glasses of wine per week    Comment: once every couple months  . Drug use: No    Comment: Pt denied; use of THC as a teenager  . Sexual  activity: Not Currently    Birth control/protection: IUD  Lifestyle  . Physical activity:    Days per week: Not on file    Minutes per session: Not on file  . Stress: Not on file  Relationships  . Social connections:    Talks on phone: Not on file    Gets together: Not on file    Attends religious service: Not on file    Active member of club or organization: Not on file    Attends meetings of clubs or organizations: Not on file    Relationship status: Not on file  Other  Topics Concern  . Not on file  Social History Narrative   Patient is single.   Patient has two children, ages 86 and 35   Patient has a high school education.   Patient works full-time.   Patient does not  drink caffeine.   Patient is right-handed.   Additional Social History:    Pain Medications: See MAR Prescriptions: See MAR Over the Counter: See MAR History of alcohol / drug use?: No history of alcohol / drug abuse(Denied any significant use)  Sleep: improving   Appetite:  Good  Current Medications: Current Facility-Administered Medications  Medication Dose Route Frequency Provider Last Rate Last Dose  . acetaminophen (TYLENOL) tablet 650 mg  650 mg Oral Q6H PRN Suella Broad, FNP   650 mg at 08/09/18 1342  . alum & mag hydroxide-simeth (MAALOX/MYLANTA) 200-200-20 MG/5ML suspension 30 mL  30 mL Oral Q4H PRN Burt Ek, Gayland Curry, FNP      . LORazepam (ATIVAN) tablet 0.5 mg  0.5 mg Oral Q4H PRN Cobos, Myer Peer, MD   0.5 mg at 08/08/18 2019  . magnesium hydroxide (MILK OF MAGNESIA) suspension 30 mL  30 mL Oral Daily PRN Suella Broad, FNP      . sertraline (ZOLOFT) tablet 50 mg  50 mg Oral Daily Cobos, Myer Peer, MD   50 mg at 08/09/18 0824  . traZODone (DESYREL) tablet 50 mg  50 mg Oral QHS PRN Cobos, Myer Peer, MD   50 mg at 08/08/18 2221    Lab Results:  Results for orders placed or performed during the hospital encounter of 08/08/18 (from the past 48 hour(s))  Lipid panel     Status: Abnormal   Collection Time: 08/09/18  6:35 AM  Result Value Ref Range   Cholesterol 172 0 - 200 mg/dL   Triglycerides 41 <150 mg/dL   HDL 60 >40 mg/dL   Total CHOL/HDL Ratio 2.9 RATIO   VLDL 8 0 - 40 mg/dL   LDL Cholesterol 104 (H) 0 - 99 mg/dL    Comment:        Total Cholesterol/HDL:CHD Risk Coronary Heart Disease Risk Table                     Men   Women  1/2 Average Risk   3.4   3.3  Average Risk       5.0   4.4  2 X Average Risk   9.6   7.1  3 X Average  Risk  23.4   11.0        Use the calculated Patient Ratio above and the CHD Risk Table to determine the patient's CHD Risk.        ATP III CLASSIFICATION (LDL):  <100     mg/dL   Optimal  100-129  mg/dL   Near or Above  Optimal  130-159  mg/dL   Borderline  160-189  mg/dL   High  >190     mg/dL   Very High Performed at [facility] [address], [place], [place] [address]   TSH     Status: None   Collection Time: 08/09/18  6:35 AM  Result Value Ref Range   TSH 1.504 0.350 - 4.500 uIU/mL    Comment: Performed by a 3rd Generation assay with a functional sensitivity of <=0.01 uIU/mL. Performed at [facility], [place] [address], [place], [place] [address]     Blood Alcohol level:  No results found for: [facility]  Metabolic Disorder Labs: No results found for: HGBA1C, MPG No results found for: PROLACTIN Lab Results  Component Value Date   CHOL 172 08/09/2018   TRIG 41 08/09/2018   HDL 60 08/09/2018   CHOLHDL 2.9 08/09/2018   VLDL 8 08/09/2018   LDLCALC 104 (H) 08/09/2018    Physical Findings: AIMS: Facial and Oral Movements Muscles of Facial Expression: None, normal Lips and Perioral Area: None, normal Jaw: None, normal Tongue: None, normal,Extremity Movements Upper (arms, wrists, hands, fingers): None, normal Lower (legs, knees, ankles, toes): None, normal, Trunk Movements Neck, shoulders, hips: None, normal, Overall Severity Severity of abnormal movements (highest score from questions above): None, normal Incapacitation due to abnormal movements: None, normal Patient's awareness of abnormal movements (rate only patient's report): No Awareness, Dental Status Current problems with teeth and/or dentures?: No Does patient usually wear dentures?: No  CIWA:  CIWA-Ar Total: 1 COWS:  COWS Total Score: 2  Musculoskeletal: Strength & Muscle Tone: within normal limits Gait & Station: normal Patient leans:  N/A  Psychiatric Specialty Exam: Physical Exam  ROS no chest pain, no shortness of breath, no vomiting   Blood pressure (!) 118/99, pulse 91, temperature 98.5 F (36.9 C), temperature source Oral, resp. rate 17, height _0  (1.651 m), weight 95.3 kg, last menstrual period 07/31/2018.Body mass index is 34.95 kg/m.  General Appearance: improving grooming   Eye Contact:  Good  Speech:  Normal Rate  Volume:  Normal  Mood:  Reports feeling better today  Affect:  Improving range of affect, less anxious  Thought Process:  Linear and Descriptions of Associations: Intact  Orientation:  Full (Time, Place, and Person)  Thought Content:  No hallucinations, no delusions  Suicidal Thoughts:  No-denies suicidal or self-injurious ideations, no homicidal or violent ideations, contracts for safety on unit  Homicidal Thoughts:  No  Memory:  Recent and remote grossly intact  Judgement:  Other:  Improving  Insight:  Fair  Psychomotor Activity:  Normal-no current psychomotor agitation or restlessness, presents calm  Concentration:  Concentration: Good and Attention Span: Good  Recall:  Good  Fund of Knowledge:  Good  Language:  Good  Akathisia:  Negative  Handed:  Right  AIMS (if indicated):     Assets:  Desire for Improvement Resilience  ADL's:  Intact  Cognition:  WNL  Sleep:  Number of Hours: 6   Assessment -  32 year old female, has two children who are currently with family friend, employed. Presented due to increased anxiety/panic symptoms, described as severe ( palpitations, anxiety, fear of dying or going crazy, feeling tremulous, subjective feelings of unreality). Endorses feeling depressed over the last several weeks following a traumatic break up and recent death of a friend. Denies suicidal ideations. Was not taking any psychiatric medications prior to admission. Denies alcohol or drug abuse ( no UDS  or BAL ).  Patient presenting with improving mood and affect.  Presents less anxious  today, less fearful and ruminative about recent episode of severe anxiety/panic attack.  Currently denies suicidal ideations .  Continues to describe a vague subjective feeling of confusion and bewilderment but presents fully alert, attentive, oriented x3.  Tolerating Zoloft and Ativan PRN's well at this time Treatment Plan Summary: Daily contact with patient to assess and evaluate symptoms and progress in treatment, Medication management, Plan Inpatient treatment and Medications as below Encourage group and milieu participation to work on coping skills and symptom reduction Continue Zoloft at 50 mg daily for depression, anxiety Continue Ativan 0.5 mg every 4 hours PRN for anxiety as needed Continue trazodone 50 mg nightly PRN for insomnia as needed Treatment team working on disposition planning options Jenne Campus, MD 08/09/2018, 5:36 PM

## 2018-08-09 NOTE — [facility] Group Notes (Signed)
LCSW Group Therapy Note 08/09/2018 2:13 PM  Type of Therapy and Topic: Group Therapy: Overcoming Obstacles  Participation Level: Active  Description of Group:  In this group patients will be encouraged to explore what they see as obstacles to their own wellness and recovery. They will be guided to discuss their thoughts, feelings, and behaviors related to these obstacles. The group will process together ways to cope with barriers, with attention given to specific choices patients can make. Each patient will be challenged to identify changes they are motivated to make in order to overcome their obstacles. This group will be process-oriented, with patients participating in exploration of their own experiences as well as giving and receiving support and challenge from other group members.  Therapeutic Goals: 1. Patient will identify personal and current obstacles as they relate to admission. 2. Patient will identify barriers that currently interfere with their wellness or overcoming obstacles.  3. Patient will identify feelings, thought process and behaviors related to these barriers. 4. Patient will identify two changes they are willing to make to overcome these obstacles:   Summary of Patient Progress  Vita was engaged and participated throughout the group session. Tasheika reports that her main obstacle is "overthinking". Ayse states that she does not know how to overcome this obstacle at this time.    Therapeutic Modalities:  Cognitive Behavioral Therapy Solution Focused Therapy Motivational Interviewing Relapse Prevention Therapy   Alcario Drought Clinical Social Worker

## 2018-08-10 DIAGNOSIS — G4700 Insomnia, unspecified: Secondary | ICD-10-CM

## 2018-08-10 DIAGNOSIS — F411 Generalized anxiety disorder: Secondary | ICD-10-CM

## 2018-08-10 DIAGNOSIS — F410 Panic disorder [episodic paroxysmal anxiety] without agoraphobia: Secondary | ICD-10-CM

## 2018-08-10 LAB — HEMOGLOBIN A1C
Hgb A1c MFr Bld: 5.4 % (ref 4.8–5.6)
Mean Plasma Glucose: 108 mg/dL

## 2018-08-10 MED ORDER — HYDROXYZINE HCL 10 MG PO TABS
10.0000 mg | ORAL_TABLET | ORAL | Status: DC | PRN
  Administered 2018-08-10 – 2018-08-12 (×4): 10 mg via ORAL
  Filled 2018-08-10 (×4): qty 1

## 2018-08-10 NOTE — Progress Notes (Signed)
[facility] MD Progress Note  08/10/2018 11:03 AM Jessica Rasmussen  MRN:  [id number] Subjective:  Patient is a 32 year old female. Presented to ED earlier today with panic symptoms. Describes " my heart was beating really fast ,I felt it was coming out of my chest. I felt chills and was shaky ". States she has recently been fearful, anxious, with thoughts that " something is going to happen to me, that I am going crazy, that I am going to die". Describe a subjective feeling of dissociation, described as " like I know I am here and this is real, but it seems like it is not real". Reports " before this I had some anxiety attacks, but not this bad". States symptoms worsened last night, and reports she hoped  they would improve once she got some sleep , but continued this AM. States she was discharged from ED but  spoke with a friend and decided to come to [facility].   In addition to anxiety symptoms as above, also reports depression, neuro-vegetative symptoms of depression.  Reports these symptoms have been present for about two to three weeks, but states she has been depressed since a break up in April 2019. She also reports a friend of hers passed away from cancer early this month. Denies suicidal ideations.   Objective: Patient is seen and examined.  Patient is a 32 year old female with a past psychiatric history significant for major depression, generalized anxiety, and what sounds like panic disorder.  She is seen in follow-up.  She stated she feels a little bit better today but continues to be anxious.  She stated she had some difficulty sleeping last night.  She was just started on the Zoloft.  We discussed the fact that it often takes more than a few days before she will notice it.  She otherwise is asking about medications for anxiety on an as-needed basis.  She is frightened by medicines, and I told her that we could try hydroxyzine 10 mg p.o. every 6 hours as needed anxiety.  She would like to be discharged  today, and she is worried about her job and her husband.  She denied any side effects to her current medications.  She denied any suicidal ideation.  Her blood pressure is mildly elevated today at 126/101.  Pulse is 96.  She is afebrile.  She the slept 5.5 hours last night.  Review of her laboratories were revealed essentially normal laboratories. Principal Problem: <principal problem not specified> Diagnosis:   Patient Active Problem List   Diagnosis Date Noted  . MDD (major depressive disorder), recurrent, severe, with psychosis ([facility]) [F33.3] 08/08/2018  . Migraine, unspecified, without mention of intractable migraine without mention of status migrainosus [G43.909] 12/22/2013  . Alteration of consciousness [R40.4] 12/22/2013   Total Time spent with patient: 15 minutes  Past Psychiatric History: See admission H&P  Past Medical History:  Past Medical History:  Diagnosis Date  . H/O syncope   . Meningitis   . Seizure [facility])    History reviewed. No pertinent surgical history. Family History:  Family History  Problem Relation Age of Onset  . Diabetes Mother   . Hypertension Mother   . Diabetes Maternal Aunt   . Diabetes Maternal Grandmother   . Hypertension Maternal Grandmother    Family Psychiatric  History: See admission H&P Social History:  Social History   Substance and Sexual Activity  Alcohol Use Yes  . Alcohol/week: 1.0 standard drinks  . Types: 1 Glasses of wine  per week   Comment: once every couple months     Social History   Substance and Sexual Activity  Drug Use No   Comment: Pt denied; use of THC as a teenager    Social History   Socioeconomic History  . Marital status: Single    Spouse name: Not on file  . Number of children: 2  . Years of education: HS  . Highest education level: Not on file  Occupational History    Employer: [employer]  Social Needs  . Financial resource strain: Not on file  . Food insecurity:    Worry: Not on file     Inability: Not on file  . Transportation needs:    Medical: Not on file    Non-medical: Not on file  Tobacco Use  . Smoking status: Never Smoker  . Smokeless tobacco: Never Used  Substance and Sexual Activity  . Alcohol use: Yes    Alcohol/week: 1.0 standard drinks    Types: 1 Glasses of wine per week    Comment: once every couple months  . Drug use: No    Comment: Pt denied; use of THC as a teenager  . Sexual activity: Not Currently    Birth control/protection: IUD  Lifestyle  . Physical activity:    Days per week: Not on file    Minutes per session: Not on file  . Stress: Not on file  Relationships  . Social connections:    Talks on phone: Not on file    Gets together: Not on file    Attends religious service: Not on file    Active member of club or organization: Not on file    Attends meetings of clubs or organizations: Not on file    Relationship status: Not on file  Other Topics Concern  . Not on file  Social History Narrative   Patient is single.   Patient has two children, ages 72 and 46   Patient has a high school education.   Patient works full-time.   Patient does not  drink caffeine.   Patient is right-handed.   Additional Social History:    Pain Medications: See MAR Prescriptions: See MAR Over the Counter: See MAR History of alcohol / drug use?: No history of alcohol / drug abuse(Denied any significant use)                    Sleep: Fair  Appetite:  Fair  Current Medications: Current Facility-Administered Medications  Medication Dose Route Frequency Provider Last Rate Last Dose  . acetaminophen (TYLENOL) tablet 650 mg  650 mg Oral Q6H PRN Maryagnes Amos, FNP   650 mg at 08/09/18 2031  . alum & mag hydroxide-simeth (MAALOX/MYLANTA) 200-200-20 MG/5ML suspension 30 mL  30 mL Oral Q4H PRN Rosario Adie, Juel Burrow, FNP      . hydrOXYzine (ATARAX/VISTARIL) tablet 10 mg  10 mg Oral Q4H PRN Antonieta Pert, MD      . LORazepam (ATIVAN)  tablet 0.5 mg  0.5 mg Oral Q4H PRN Cobos, Rockey Situ, MD   0.5 mg at 08/09/18 2325  . magnesium hydroxide (MILK OF MAGNESIA) suspension 30 mL  30 mL Oral Daily PRN Maryagnes Amos, FNP      . sertraline (ZOLOFT) tablet 50 mg  50 mg Oral Daily Cobos, Rockey Situ, MD   50 mg at 08/10/18 0834  . traZODone (DESYREL) tablet 50 mg  50 mg Oral QHS PRN Cobos, Rockey Situ, MD  50 mg at 08/09/18 2325    Lab Results:  Results for orders placed or performed during the hospital encounter of 08/08/18 (from the past 48 hour(s))  Hemoglobin A1c     Status: None   Collection Time: 08/09/18  6:35 AM  Result Value Ref Range   Hgb A1c MFr Bld 5.4 4.8 - 5.6 %    Comment: (NOTE)         Prediabetes: 5.7 - 6.4         Diabetes: >6.4         Glycemic control for adults with diabetes: <7.0    Mean Plasma Glucose 108 mg/dL    Comment: (NOTE) Performed At: [facility] [address] [place], [place] [id number] Jolene Schimke MD NW:[id number]   Lipid panel     Status: Abnormal   Collection Time: 08/09/18  6:35 AM  Result Value Ref Range   Cholesterol 172 0 - 200 mg/dL   Triglycerides 41 <578 mg/dL   HDL 60 >46 mg/dL   Total CHOL/HDL Ratio 2.9 RATIO   VLDL 8 0 - 40 mg/dL   LDL Cholesterol 962 (H) 0 - 99 mg/dL    Comment:        Total Cholesterol/HDL:CHD Risk Coronary Heart Disease Risk Table                     Men   Women  1/2 Average Risk   3.4   3.3  Average Risk       5.0   4.4  2 X Average Risk   9.6   7.1  3 X Average Risk  23.4   11.0        Use the calculated Patient Ratio above and the CHD Risk Table to determine the patient's CHD Risk.        ATP III CLASSIFICATION (LDL):  <100     mg/dL   Optimal  952-841  mg/dL   Near or Above                    Optimal  130-159  mg/dL   Borderline  324-401  mg/dL   High  >027     mg/dL   Very High Performed at [facility], [address], [place], [place] [address]   TSH     Status: None   Collection Time:  08/09/18  6:35 AM  Result Value Ref Range   TSH 1.504 0.350 - 4.500 uIU/mL    Comment: Performed by a 3rd Generation assay with a functional sensitivity of <=0.01 uIU/mL. Performed at [facility], [address] [place], [place] [address]   Rapid urine drug screen (hospital performed)     Status: None   Collection Time: 08/09/18  6:24 PM  Result Value Ref Range   Opiates NONE DETECTED NONE DETECTED   Cocaine NONE DETECTED NONE DETECTED   Benzodiazepines NONE DETECTED NONE DETECTED   Amphetamines NONE DETECTED NONE DETECTED   Tetrahydrocannabinol NONE DETECTED NONE DETECTED   Barbiturates NONE DETECTED NONE DETECTED    Comment: (NOTE) DRUG SCREEN FOR MEDICAL PURPOSES ONLY.  IF CONFIRMATION IS NEEDED FOR ANY PURPOSE, NOTIFY LAB WITHIN 5 DAYS. LOWEST DETECTABLE LIMITS FOR URINE DRUG SCREEN Drug Class                     Cutoff (ng/mL) Amphetamine and metabolites    1000 Barbiturate and metabolites    200 Benzodiazepine  200 Tricyclics and metabolites     300 Opiates and metabolites        300 Cocaine and metabolites        300 THC                            50 Performed at [facility], [address], [place], [place] [address]     Blood Alcohol level:  No results found for: [facility]  Metabolic Disorder Labs: Lab Results  Component Value Date   HGBA1C 5.4 08/09/2018   MPG 108 08/09/2018   No results found for: PROLACTIN Lab Results  Component Value Date   CHOL 172 08/09/2018   TRIG 41 08/09/2018   HDL 60 08/09/2018   CHOLHDL 2.9 08/09/2018   VLDL 8 08/09/2018   LDLCALC 104 (H) 08/09/2018    Physical Findings: AIMS: Facial and Oral Movements Muscles of Facial Expression: None, normal Lips and Perioral Area: None, normal Jaw: None, normal Tongue: None, normal,Extremity Movements Upper (arms, wrists, hands, fingers): None, normal Lower (legs, knees, ankles, toes): None, normal, Trunk Movements Neck, shoulders,  hips: None, normal, Overall Severity Severity of abnormal movements (highest score from questions above): None, normal Incapacitation due to abnormal movements: None, normal Patient's awareness of abnormal movements (rate only patient's report): No Awareness, Dental Status Current problems with teeth and/or dentures?: No Does patient usually wear dentures?: No  CIWA:  CIWA-Ar Total: 1 COWS:  COWS Total Score: 2  Musculoskeletal: Strength & Muscle Tone: within normal limits Gait & Station: normal Patient leans: N/A  Psychiatric Specialty Exam: Physical Exam  ROS  Blood pressure (!) 126/101, pulse 96, temperature 98 F (36.7 C), temperature source Oral, resp. rate 20, height 5\' 5"  (1.651 m), weight 95.3 kg, last menstrual period 07/31/2018.Body mass index is 34.95 kg/m.  General Appearance: Casual  Eye Contact:  Fair  Speech:  Normal Rate  Volume:  Normal  Mood:  Anxious  Affect:  Congruent  Thought Process:  Coherent and Descriptions of Associations: Intact  Orientation:  Full (Time, Place, and Person)  Thought Content:  Logical  Suicidal Thoughts:  No  Homicidal Thoughts:  No  Memory:  Immediate;   Fair Recent;   Fair Remote;   Fair  Judgement:  Intact  Insight:  Fair  Psychomotor Activity:  Increased  Concentration:  Concentration: Fair and Attention Span: Fair  Recall:  [employer] of Knowledge:  Fair  Language:  Fair  Akathisia:  Negative  Handed:  Right  AIMS (if indicated):     Assets:  Communication Skills Desire for Improvement Financial Resources/Insurance Housing Physical Health Resilience Social Support Talents/Skills  ADL's:  Intact  Cognition:  WNL  Sleep:  Number of Hours: 5.5     Treatment Plan Summary: Daily contact with patient to assess and evaluate symptoms and progress in treatment, Medication management and Plan : Patient is seen and examined.  Patient is a 32 year old female with the above-stated past psychiatric history was seen in  follow-up.  #1 generalized anxiety disorder/panic disorder-continue Zoloft 50 mg p.o. daily, continue lorazepam 0.5 mg p.o. 3 times daily as needed anxiety, start hydroxyzine 10 mg p.o. 3 times daily as needed.  #2 major depression-continue Zoloft 50 mg p.o. daily.  #3 insomnia-continue as needed trazodone 50 mg p.o. nightly.  #4 disposition planning-in progress.  Antonieta Pert, MD 08/10/2018, 11:03 AM

## 2018-08-10 NOTE — Plan of Care (Signed)
Nurse discussed anxiety, depression, coping skills with patient. 

## 2018-08-10 NOTE — [facility] Group Notes (Signed)
LCSW Group Therapy Note 08/10/2018 12:12 PM  Type of Therapy/Topic: Group Therapy: Feelings about Diagnosis  Participation Level: Active   Description of Group:  This group will allow patients to explore their thoughts and feelings about diagnoses they have received. Patients will be guided to explore their level of understanding and acceptance of these diagnoses. Facilitator will encourage patients to process their thoughts and feelings about the reactions of others to their diagnosis and will guide patients in identifying ways to discuss their diagnosis with significant others in their lives. This group will be process-oriented, with patients participating in exploration of their own experiences, giving and receiving support, and processing challenge from other group members.  Therapeutic Goals: 1. Patient will demonstrate understanding of diagnosis as evidenced by identifying two or more symptoms of the disorder 2. Patient will be able to express two feelings regarding the diagnosis 3. Patient will demonstrate their ability to communicate their needs through discussion and/or role play  Summary of Patient Progress:  Jessica Rasmussen was engaged and participated throughout the group session. Jessica Rasmussen states that she has not accepted the fact that she has a mental health diagnosis.     Therapeutic Modalities:  Cognitive Behavioral Therapy Brief Therapy Feelings Identification    Kuuipo Anzaldo Catalina Antigua Clinical Social Worker

## 2018-08-10 NOTE — Progress Notes (Signed)
D:  Patient's self inventory sheet, patient has fair sleep, sleep medication helpful.  Fair appetite, normal energy level, poor concentration.  Denied depression and hopeless, rated anxiety #10.  Denied withdrawals.  Denied SI.  Denied physical problems.  Denied physical pain.  Goal is concentration, anxiety under control, racing thoughts.  Plans to work on Pharmacologist.  Don't understand how I woke up with anxiety.  No discharge plans. A:  Medications administered per MD orders.  Emotional support and encouragement given patient. R:  Denied SI and HI, contracts for safety.  Denied A/V hallucinations.  Safety maintained with 15 minute checks.

## 2018-08-11 MED ORDER — LORAZEPAM 0.5 MG PO TABS: 0.5000 mg | ORAL_TABLET | Freq: Three times a day (TID) | ORAL | Status: DC | PRN

## 2018-08-11 NOTE — Progress Notes (Signed)
Adult Psychoeducational Group Note  Date:  08/11/2018 Time:  4:51 AM  Group Topic/Focus:  Wrap-Up Group:   The focus of this group is to help patients review their daily goal of treatment and discuss progress on daily workbooks.  Participation Level:  Active  Participation Quality:  Appropriate  Affect:  Appropriate  Cognitive:  Appropriate  Insight: Appropriate  Engagement in Group:  Engaged  Modes of Intervention:  Discussion  Additional Comments:  Pt attend warp up group. Her day was a 10. Her goal was to keep anxiety under control  Chauncey Fischer 08/11/2018, 4:51 AM

## 2018-08-11 NOTE — Progress Notes (Signed)
Pt has been in the dayroom all evening socializing and talking with other patients.  Pt reports her day was ok.  She denies SI/HI/AVH at this time.  She says the meds seem to be working.  She requested and received meds for sleep and anxiety at bedtime.  Support and encouragement offered.  Discharge plans are in process.  Safety maintained with q15 minute checks.

## 2018-08-11 NOTE — Progress Notes (Signed)
Recreation Therapy Notes  Date: 10.23.19 Time: 0930 Location: [address] Dayroom  Group Topic: Stress Management  Goal Area(s) Addresses:  Patient will verbalize importance of using healthy stress management.  Patient will identify positive emotions associated with healthy stress management.   Intervention: Stress Management  Activity :  Guided Imagery.  LRT introduced the stress management technique of guided imagery.  LRT read a script to allow patients to envision their peaceful place.  Patients were to follow along as script was read to engage in the activity.  Education:  Stress Management, Discharge Planning.   Education Outcome: Acknowledges edcuation/In group clarification offered/Needs additional education  Clinical Observations/Feedback: Pt did not attend group.      Denene Alamillo, LRT/CTRS         Nakshatra Klose A 08/11/2018 11:24 AM 

## 2018-08-11 NOTE — Therapy (Signed)
Occupational Therapy Group Note  Date:  08/11/2018 Time:  2:36 PM  Group Topic/Focus:  Yoga/Relaxation  Participation Level:  Active  Participation Quality:  Appropriate  Affect:  Flat  Cognitive:  Appropriate  Insight: Improving  Engagement in Group:  Engaged  Modes of Intervention:  Activity, Discussion, Education and Socialization  Additional Comments:    S: no subjective was given this date  O: Education given on stress management and relaxation to develop coping skills when reintegrating into community. Healthy stress management strategies brainstormed within group, pt encouraged to contribute responses. Pt guided through relaxation chair yoga. Pt asked if pain a factor in mobility, adapted exercises to mett mobility needs and safety. PMR delivered at end of session.   A: Pt presents to group with flat affect, did not engage in discussion, but participatory in yoga session. Pt appearing drowsy occasionally throughout session and end of session.  P: OT will continue to follow up for implementation of coping skills while pt acute.   Dalphine Handing, MSOT, OTR/L Behavioral Health OT/ Acute Relief OT  Dalphine Handing 08/11/2018, 2:36 PM

## 2018-08-11 NOTE — Progress Notes (Signed)
[facility] MD Progress Note  08/11/2018 1:18 PM Jessica Rasmussen  MRN:  [id number]   Subjective: Patient reports that she is feeling somewhat better.  He admits he feels that her anxiety is still high and rates it at a 7 out of 10 with 10 being the highest.  She denies any depression or suicidal or homicidal ideations and denies any hallucinations.  She reports that sometimes she feels as though she gets disoriented when she first gets up in the mornings.  She denies any kind of hallucinations or disassociation that goes along with it.  She reports that she slept a little later than she intended today because the medication she took last night, but otherwise has no other medication side effects.  She reports having a good appetite and slept well last night.  She is continually asking when she can be discharged home.  Objective: Patient's chart and findings reviewed and discussed with treatment team.  Patient presents in her room asleep.  She is easily awakened.  She is calm, cooperative, and pleasant.  She has been seen on the milieu interacting with peers and staff after she is got out of bed.  She has been interacting appropriately and attending groups.  Feel patient has shown improvement.  Once awake she does show congruent affect and pleasant mood.  Principal Problem: MDD (major depressive disorder), recurrent, severe, with psychosis (HCC) Diagnosis:   Patient Active Problem List   Diagnosis Date Noted  . Panic disorder [F41.0]   . Generalized anxiety disorder [F41.1]   . MDD (major depressive disorder), recurrent, severe, with psychosis (HCC) [F33.3] 08/08/2018  . Migraine, unspecified, without mention of intractable migraine without mention of status migrainosus [G43.909] 12/22/2013  . Alteration of consciousness [R40.4] 12/22/2013   Total Time spent with patient: 20 minutes  Past Psychiatric History: See H&P  Past Medical History:  Past Medical History:  Diagnosis Date  . H/O syncope   .  Meningitis   . Seizure [facility])    History reviewed. No pertinent surgical history. Family History:  Family History  Problem Relation Age of Onset  . Diabetes Mother   . Hypertension Mother   . Diabetes Maternal Aunt   . Diabetes Maternal Grandmother   . Hypertension Maternal Grandmother    Family Psychiatric  History: See H&P Social History:  Social History   Substance and Sexual Activity  Alcohol Use Yes  . Alcohol/week: 1.0 standard drinks  . Types: 1 Glasses of wine per week   Comment: once every couple months     Social History   Substance and Sexual Activity  Drug Use No   Comment: Pt denied; use of THC as a teenager    Social History   Socioeconomic History  . Marital status: Single    Spouse name: Not on file  . Number of children: 2  . Years of education: HS  . Highest education level: Not on file  Occupational History    Employer: [employer]  Social Needs  . Financial resource strain: Not on file  . Food insecurity:    Worry: Not on file    Inability: Not on file  . Transportation needs:    Medical: Not on file    Non-medical: Not on file  Tobacco Use  . Smoking status: Never Smoker  . Smokeless tobacco: Never Used  Substance and Sexual Activity  . Alcohol use: Yes    Alcohol/week: 1.0 standard drinks    Types: 1 Glasses of wine per week  Comment: once every couple months  . Drug use: No    Comment: Pt denied; use of THC as a teenager  . Sexual activity: Not Currently    Birth control/protection: IUD  Lifestyle  . Physical activity:    Days per week: Not on file    Minutes per session: Not on file  . Stress: Not on file  Relationships  . Social connections:    Talks on phone: Not on file    Gets together: Not on file    Attends religious service: Not on file    Active member of club or organization: Not on file    Attends meetings of clubs or organizations: Not on file    Relationship status: Not on file  Other Topics Concern  . Not  on file  Social History Narrative   Patient is single.   Patient has two children, ages 10 and 83   Patient has a high school education.   Patient works full-time.   Patient does not  drink caffeine.   Patient is right-handed.   Additional Social History:    Pain Medications: See MAR Prescriptions: See MAR Over the Counter: See MAR History of alcohol / drug use?: No history of alcohol / drug abuse(Denied any significant use)                    Sleep: Good  Appetite:  Good  Current Medications: Current Facility-Administered Medications  Medication Dose Route Frequency Provider Last Rate Last Dose  . acetaminophen (TYLENOL) tablet 650 mg  650 mg Oral Q6H PRN Maryagnes Amos, FNP   650 mg at 08/10/18 1359  . alum & mag hydroxide-simeth (MAALOX/MYLANTA) 200-200-20 MG/5ML suspension 30 mL  30 mL Oral Q4H PRN Rosario Adie, Juel Burrow, FNP      . hydrOXYzine (ATARAX/VISTARIL) tablet 10 mg  10 mg Oral Q4H PRN Antonieta Pert, MD   10 mg at 08/11/18 1610  . LORazepam (ATIVAN) tablet 0.5 mg  0.5 mg Oral Q8H PRN Naara Kelty, Feliz Beam B, FNP      . magnesium hydroxide (MILK OF MAGNESIA) suspension 30 mL  30 mL Oral Daily PRN Rosario Adie, Juel Burrow, FNP      . sertraline (ZOLOFT) tablet 50 mg  50 mg Oral Daily Cobos, Rockey Situ, MD   50 mg at 08/11/18 0820  . traZODone (DESYREL) tablet 50 mg  50 mg Oral QHS PRN Cobos, Rockey Situ, MD   50 mg at 08/10/18 2202    Lab Results:  Results for orders placed or performed during the hospital encounter of 08/08/18 (from the past 48 hour(s))  Rapid urine drug screen (hospital performed)     Status: None   Collection Time: 08/09/18  6:24 PM  Result Value Ref Range   Opiates NONE DETECTED NONE DETECTED   Cocaine NONE DETECTED NONE DETECTED   Benzodiazepines NONE DETECTED NONE DETECTED   Amphetamines NONE DETECTED NONE DETECTED   Tetrahydrocannabinol NONE DETECTED NONE DETECTED   Barbiturates NONE DETECTED NONE DETECTED    Comment:  (NOTE) DRUG SCREEN FOR MEDICAL PURPOSES ONLY.  IF CONFIRMATION IS NEEDED FOR ANY PURPOSE, NOTIFY LAB WITHIN 5 DAYS. LOWEST DETECTABLE LIMITS FOR URINE DRUG SCREEN Drug Class                     Cutoff (ng/mL) Amphetamine and metabolites    1000 Barbiturate and metabolites    200 Benzodiazepine  200 Tricyclics and metabolites     300 Opiates and metabolites        300 Cocaine and metabolites        300 THC                            50 Performed at [facility], [address] [place], [place] [address]     Blood Alcohol level:  No results found for: [facility]  Metabolic Disorder Labs: Lab Results  Component Value Date   HGBA1C 5.4 08/09/2018   MPG 108 08/09/2018   No results found for: PROLACTIN Lab Results  Component Value Date   CHOL 172 08/09/2018   TRIG 41 08/09/2018   HDL 60 08/09/2018   CHOLHDL 2.9 08/09/2018   VLDL 8 08/09/2018   LDLCALC 104 (H) 08/09/2018    Physical Findings: AIMS: Facial and Oral Movements Muscles of Facial Expression: None, normal Lips and Perioral Area: None, normal Jaw: None, normal Tongue: None, normal,Extremity Movements Upper (arms, wrists, hands, fingers): None, normal Lower (legs, knees, ankles, toes): None, normal, Trunk Movements Neck, shoulders, hips: None, normal, Overall Severity Severity of abnormal movements (highest score from questions above): None, normal Incapacitation due to abnormal movements: None, normal Patient's awareness of abnormal movements (rate only patient's report): No Awareness, Dental Status Current problems with teeth and/or dentures?: No Does patient usually wear dentures?: No  CIWA:  CIWA-Ar Total: 1 COWS:  COWS Total Score: 2  Musculoskeletal: Strength & Muscle Tone: within normal limits Gait & Station: normal Patient leans: N/A  Psychiatric Specialty Exam: Physical Exam  Nursing note and vitals reviewed. Constitutional: She is oriented to person, place, and  time. She appears well-developed and well-nourished.  Cardiovascular: Normal rate.  Respiratory: Effort normal.  Musculoskeletal: Normal range of motion.  Neurological: She is alert and oriented to person, place, and time.  Skin: Skin is warm.    Review of Systems  Constitutional: Negative.   HENT: Negative.   Eyes: Negative.   Respiratory: Negative.   Cardiovascular: Negative.   Gastrointestinal: Negative.   Genitourinary: Negative.   Musculoskeletal: Negative.   Skin: Negative.   Neurological: Negative.   Endo/Heme/Allergies: Negative.   Psychiatric/Behavioral: The patient is nervous/anxious.     Blood pressure 108/63, pulse 91, temperature 98 F (36.7 C), temperature source Oral, resp. rate 20, height 5\' 5"  (1.651 m), weight 95.3 kg, last menstrual period 07/31/2018.Body mass index is 34.95 kg/m.  General Appearance: Casual  Eye Contact:  Good  Speech:  Clear and Coherent and Normal Rate  Volume:  Normal  Mood:  Anxious  Affect:  Congruent  Thought Process:  Goal Directed and Descriptions of Associations: Intact  Orientation:  Full (Time, Place, and Person)  Thought Content:  WDL  Suicidal Thoughts:  No  Homicidal Thoughts:  No  Memory:  Immediate;   Good Recent;   Good Remote;   Good  Judgement:  Fair  Insight:  Fair  Psychomotor Activity:  Normal  Concentration:  Concentration: Good and Attention Span: Good  Recall:  Good  Fund of Knowledge:  Good  Language:  Good  Akathisia:  No  Handed:  Right  AIMS (if indicated):     Assets:  Communication Skills Desire for Improvement Financial Resources/Insurance Housing Physical Health Social Support Transportation  ADL's:  Intact  Cognition:  WNL  Sleep:  Number of Hours: 6.75   Problems Addressed MDD severe recurrent GAD  Treatment Plan Summary: Daily contact  with patient to assess and evaluate symptoms and progress in treatment, Medication management and Plan is to: Continue Zoloft 50 mg p.o. daily for  mood stability Continue trazodone 50 mg p.o. nightly as needed for insomnia Continue Vistaril 10 mg p.o. every 4 hours as needed for anxiety Decrease Ativan to 0.5 mg every 8 hours as needed for severe anxiety Encourage group therapy participation Discharge set for tomorrow if patient continues to progress  Maryfrances Bunnell, FNP 08/11/2018, 1:18 PM

## 2018-08-11 NOTE — Progress Notes (Signed)
D: Patient denies SI, HI or AVH today. Patient presents as flat and depressed but pleasant and cooperative.  She states that she did not sleep well last night due to "racing thoughts".  Pt. States that she feels like she sleeps some but "cannot shut my brain off".  Pt. States that her appetite is good and rates her depression as 0/10 and anxiety 6/10.  Pt.'s goal for today is to work on relaxing and controlling her thoughts.  Pt. Complained of headache but was medicated as ordered with relief of her discomfort.   A: Patient given emotional support from RN. Patient encouraged to come to staff with concerns and/or questions. Patient's medication routine continued. Patient's orders and plan of care reviewed.   R: Patient remains appropriate and cooperative. Will continue to monitor patient q15 minutes for safety.

## 2018-08-11 NOTE — [facility] Group Notes (Signed)
[facility] Group Therapy      08/11/2018 2:10 PM  Type of Therapy: Mental Health [facility] Presentation   Summary of Progress/Problems: Invited, chose not to attend.   Alcario Drought Clinical Social Worker

## 2018-08-12 MED ORDER — SERTRALINE HCL 50 MG PO TABS
50.0000 mg | ORAL_TABLET | Freq: Every day | ORAL | Status: DC
  Administered 2018-08-13: 50 mg via ORAL
  Filled 2018-08-12 (×3): qty 1

## 2018-08-12 MED ORDER — METOPROLOL SUCCINATE 12.5 MG HALF TABLET
12.5000 mg | ORAL_TABLET | Freq: Every day | ORAL | Status: DC
  Administered 2018-08-12 – 2018-08-14 (×3): 12.5 mg via ORAL
  Filled 2018-08-12 (×7): qty 1

## 2018-08-12 NOTE — Progress Notes (Signed)
D:  Patient's self inventory sheet, patient has poor sleep, sleep medication not helpful.  Fair appetite, normal energy level, poor concentration.  Denied depression.  Rated hopeless 6, anxiety 5.  Denied withdrawals.  Denied SI.  Denied physical problems, then checked headaches.  Denied physical pain.  Goal is concentration, anxiety and racing thoughts.  Nervous about being discharged.  No discharge plans. A:  Medications administered per MD orders.  Emotional support and encouragement given patient. R:  Denied SI and HI, contracts for safety.  Denied A/V hallucinations.  Safety maintained with 15 minute checks.

## 2018-08-12 NOTE — Plan of Care (Signed)
Nurse discussed anxiety, depression, coping skills with patient. 

## 2018-08-12 NOTE — [facility] Group Notes (Signed)
LCSW Group Therapy Note 08/12/2018 4:03 PM  Type of Therapy and Topic: Group Therapy: Avoiding Self-Sabotaging and Enabling Behaviors  Participation Level: Active  Description of Group:  In this group, patients will learn how to identify obstacles, self-sabotaging and enabling behaviors, as well as: what are they, why do we do them and what needs these behaviors meet. Discuss unhealthy relationships and how to have positive healthy boundaries with those that sabotage and enable. Explore aspects of self-sabotage and enabling in yourself and how to limit these self-destructive behaviors in everyday life.  Therapeutic Goals: 1. Patient will identify one obstacle that relates to self-sabotage and enabling behaviors 2. Patient will identify one personal self-sabotaging or enabling behavior they did prior to admission 3. Patient will state a plan to change the above identified behavior 4. Patient will demonstrate ability to communicate their needs through discussion and/or role play.   Summary of Patient Progress:  Jessica Rasmussen was engaged and participated throughout the group session. Jessica Rasmussen reports that her self sabotaging behavior is "beating myself up over things I cant change".    Therapeutic Modalities:  Cognitive Behavioral Therapy Person-Centered Therapy Motivational Interviewing   Baldo Daub LCSWA Clinical Social Worker

## 2018-08-12 NOTE — Progress Notes (Signed)
Pt continues to c/o racing thoughts and anxiety.  She came out of group at one point because the discussion was upsetting her and she felt she needed something for anxiety.  She wanted something that would not make her sleepy, but all the prn meds were sedating.  She chose not take anything at that time.  She denies SI/HI/AVH.  She has been appropriate and cooperative.  She came at bedtime and took the Vistaril 10 mg, but after an hour, returned to request the Trazodone 50 mg, stating her mind was still racing and the the Vistaril had not helped.  Pt was encouraged to speak to the doctor tomorrow about her medication concerns and her drowsiness.  Support and encouragement offered.  Discharge plans are in process.  Safety maintained with q15 minute checks.

## 2018-08-12 NOTE — Progress Notes (Signed)
[facility] MD Progress Note  08/12/2018 10:37 AM Jessica Rasmussen  MRN:  [id number] Subjective:  Patient is a 32 year old female. Presented to ED earlier today with panic symptoms. Describes " my heart was beating really fast ,I felt it was coming out of my chest. I felt chills and was shaky ". States she has recently been fearful, anxious, with thoughts that " something is going to happen to me, that I am going crazy, that I am going to die". Describe a subjective feeling of dissociation, described as " like I know I am here and this is real, but it seems like it is not real". Reports " before this I had some anxiety attacks, but not this bad". States symptoms worsened last night, and reports she hoped they would improve once she got some sleep , but continued this AM. States she was discharged from ED but spoke with a friend and decided to come to [facility].  In addition to anxiety symptoms as above, also reports depression, neuro-vegetative symptoms of depression.  Reports these symptoms have been present for about two to three weeks, but states she has been depressed since a break up in April 2019. She also reports a friend of hers passed away from cancer early this month. Denies suicidal ideations.  Objective: Patient is seen and examined.  Patient is a 32 year old female with a past psychiatric history significant for major depression, generalized anxiety and panic disorder symptoms.  She is seen in follow-up.  She stated that she is slightly better and not having any suicidal ideation.  She stated that she continues to have racing thoughts.  She stated it is worse at night.  She stated that she was in bed this morning because the medication she took this morning makes her sedated.  This is the Zoloft.  We discussed options with dosing.  Her blood pressure is been elevated throughout the course the hospitalization.  She is not on any blood pressure medicine at this time.  As stated above, her temperature was  within normal limits, her heart rate was 85, but her blood pressure this morning was 138/97.  Nursing notes reflect that she slept 6.25 hours last night.  She denied any side effects to the Zoloft outside of sedation.  Principal Problem: MDD (major depressive disorder), recurrent, severe, with psychosis (HCC) Diagnosis:   Patient Active Problem List   Diagnosis Date Noted  . Panic disorder [F41.0]   . Generalized anxiety disorder [F41.1]   . MDD (major depressive disorder), recurrent, severe, with psychosis (HCC) [F33.3] 08/08/2018  . Migraine, unspecified, without mention of intractable migraine without mention of status migrainosus [G43.909] 12/22/2013  . Alteration of consciousness [R40.4] 12/22/2013   Total Time spent with patient: 15 minutes  Past Psychiatric History: See admission H&P  Past Medical History:  Past Medical History:  Diagnosis Date  . H/O syncope   . Meningitis   . Seizure [facility])    History reviewed. No pertinent surgical history. Family History:  Family History  Problem Relation Age of Onset  . Diabetes Mother   . Hypertension Mother   . Diabetes Maternal Aunt   . Diabetes Maternal Grandmother   . Hypertension Maternal Grandmother    Family Psychiatric  History: See admission H&P Social History:  Social History   Substance and Sexual Activity  Alcohol Use Yes  . Alcohol/week: 1.0 standard drinks  . Types: 1 Glasses of wine per week   Comment: once every couple months  Social History   Substance and Sexual Activity  Drug Use No   Comment: Pt denied; use of THC as a teenager    Social History   Socioeconomic History  . Marital status: Single    Spouse name: Not on file  . Number of children: 2  . Years of education: HS  . Highest education level: Not on file  Occupational History    Employer: [employer]  Social Needs  . Financial resource strain: Not on file  . Food insecurity:    Worry: Not on file    Inability: Not on file  .  Transportation needs:    Medical: Not on file    Non-medical: Not on file  Tobacco Use  . Smoking status: Never Smoker  . Smokeless tobacco: Never Used  Substance and Sexual Activity  . Alcohol use: Yes    Alcohol/week: 1.0 standard drinks    Types: 1 Glasses of wine per week    Comment: once every couple months  . Drug use: No    Comment: Pt denied; use of THC as a teenager  . Sexual activity: Not Currently    Birth control/protection: IUD  Lifestyle  . Physical activity:    Days per week: Not on file    Minutes per session: Not on file  . Stress: Not on file  Relationships  . Social connections:    Talks on phone: Not on file    Gets together: Not on file    Attends religious service: Not on file    Active member of club or organization: Not on file    Attends meetings of clubs or organizations: Not on file    Relationship status: Not on file  Other Topics Concern  . Not on file  Social History Narrative   Patient is single.   Patient has two children, ages 98 and 28   Patient has a high school education.   Patient works full-time.   Patient does not  drink caffeine.   Patient is right-handed.   Additional Social History:    Pain Medications: See MAR Prescriptions: See MAR Over the Counter: See MAR History of alcohol / drug use?: No history of alcohol / drug abuse(Denied any significant use)                    Sleep: Fair  Appetite:  Fair  Current Medications: Current Facility-Administered Medications  Medication Dose Route Frequency Provider Last Rate Last Dose  . acetaminophen (TYLENOL) tablet 650 mg  650 mg Oral Q6H PRN Maryagnes Amos, FNP   650 mg at 08/11/18 1330  . alum & mag hydroxide-simeth (MAALOX/MYLANTA) 200-200-20 MG/5ML suspension 30 mL  30 mL Oral Q4H PRN Rosario Adie, Juel Burrow, FNP      . hydrOXYzine (ATARAX/VISTARIL) tablet 10 mg  10 mg Oral Q4H PRN Antonieta Pert, MD   10 mg at 08/11/18 2214  . LORazepam (ATIVAN) tablet 0.5  mg  0.5 mg Oral Q8H PRN Money, Feliz Beam B, FNP      . magnesium hydroxide (MILK OF MAGNESIA) suspension 30 mL  30 mL Oral Daily PRN Rosario Adie, Juel Burrow, FNP      . [START ON 08/13/2018] sertraline (ZOLOFT) tablet 50 mg  50 mg Oral QHS Antonieta Pert, MD      . traZODone (DESYREL) tablet 50 mg  50 mg Oral QHS PRN Cobos, Rockey Situ, MD   50 mg at 08/11/18 2314    Lab Results: No  results found for this or any previous visit (from the past 48 hour(s)).  Blood Alcohol level:  No results found for: [facility]  Metabolic Disorder Labs: Lab Results  Component Value Date   HGBA1C 5.4 08/09/2018   MPG 108 08/09/2018   No results found for: PROLACTIN Lab Results  Component Value Date   CHOL 172 08/09/2018   TRIG 41 08/09/2018   HDL 60 08/09/2018   CHOLHDL 2.9 08/09/2018   VLDL 8 08/09/2018   LDLCALC 104 (H) 08/09/2018    Physical Findings: AIMS: Facial and Oral Movements Muscles of Facial Expression: None, normal Lips and Perioral Area: None, normal Jaw: None, normal Tongue: None, normal,Extremity Movements Upper (arms, wrists, hands, fingers): None, normal Lower (legs, knees, ankles, toes): None, normal, Trunk Movements Neck, shoulders, hips: None, normal, Overall Severity Severity of abnormal movements (highest score from questions above): None, normal Incapacitation due to abnormal movements: None, normal Patient's awareness of abnormal movements (rate only patient's report): No Awareness, Dental Status Current problems with teeth and/or dentures?: No Does patient usually wear dentures?: No  CIWA:  CIWA-Ar Total: 1 COWS:  COWS Total Score: 2  Musculoskeletal: Strength & Muscle Tone: within normal limits Gait & Station: normal Patient leans: N/A  Psychiatric Specialty Exam: Physical Exam  Nursing note and vitals reviewed. Constitutional: She is oriented to person, place, and time. She appears well-developed and well-nourished.  HENT:  Head: Normocephalic and atraumatic.   Respiratory: Effort normal.  Neurological: She is alert and oriented to person, place, and time.    ROS  Blood pressure (!) 138/97, pulse 85, temperature 98.5 F (36.9 C), temperature source Oral, resp. rate 20, height 5\' 5"  (1.651 m), weight 95.3 kg, last menstrual period 07/31/2018.Body mass index is 34.95 kg/m.  General Appearance: Casual  Eye Contact:  Fair  Speech:  Normal Rate  Volume:  Decreased  Mood:  Depressed  Affect:  Congruent  Thought Process:  Coherent and Descriptions of Associations: Intact  Orientation:  Full (Time, Place, and Person)  Thought Content:  Logical  Suicidal Thoughts:  No  Homicidal Thoughts:  No  Memory:  Immediate;   Fair Recent;   Fair Remote;   Fair  Judgement:  Intact  Insight:  Fair  Psychomotor Activity:  Decreased and Psychomotor Retardation  Concentration:  Concentration: Fair and Attention Span: Fair  Recall:  [employer] of Knowledge:  Fair  Language:  Good  Akathisia:  Negative  Handed:  Right  AIMS (if indicated):     Assets:  Communication Skills Desire for Improvement Housing Physical Health Resilience Social Support  ADL's:  Intact  Cognition:  WNL  Sleep:  Number of Hours: 6.25     Treatment Plan Summary: Daily contact with patient to assess and evaluate symptoms and progress in treatment, Medication management and Plan : Patient is seen and examined.  Patient is a 32 year old female with a past psychiatric history significant for major depression and generalized anxiety disorder.  #1 major depression-continue Zoloft 50 mg but change dosing time to nightly for mood stability.  #2 generalized anxiety disorder-encourage patient to attempt to try the Vistaril 10 mg p.o. as needed for anxiety, continue Zoloft 50 mg p.o. nightly for anxiety control.  Patient also has available Ativan 0.5 mg p.o. every 8 hours as needed severe anxiety.  #3 hypertension-we will add low-dose metoprolol extended release 12.5 mg p.o. daily starting  today to see if that decreases her blood pressure.  #4 disposition planning-if all continues to go well we  will plan on discharge in 1 to 2 days.  Antonieta Pert, MD 08/12/2018, 10:37 AM

## 2018-08-13 MED ORDER — BUSPIRONE HCL 5 MG PO TABS
5.0000 mg | ORAL_TABLET | Freq: Three times a day (TID) | ORAL | Status: DC
  Administered 2018-08-13 – 2018-08-14 (×4): 5 mg via ORAL
  Filled 2018-08-13 (×11): qty 1

## 2018-08-13 NOTE — Progress Notes (Addendum)
D: Patient is alert, oriented, pleasant, and cooperative. Denies SI, HI, AVH, and verbally contracts for safety. Patient presents with anxious/flat facial expression and anxious affect. Patient endorses concentration problems, racing thoughts, anxiety. Patient reports "trying to cope with racing thoughts and going home and when meds will have full effect". Patient also reports "worried about going home like this but I want to go home". Patient reports trying to not take sleeping medication last night but not sleeping well till she took it. Patient denies physical symptoms/pain. Patient requested PRN medication for anxiety and sleep.   A: Scheduled medications administered per MD order. PRN anxiety and sleep medication administered per MD order. Support provided. Patient educated on safety on the unit. Patient educated on anxiety, depression, and sleep medications. Routine safety checks every 15 minutes. Patient stated understanding to tell nurse about any new physical symptoms. Patient understands to tell staff of any needs.     R: No adverse drug reactions noted. Patient verbally contracts for safety. Patient remains safe at this time and will continue to monitor.

## 2018-08-13 NOTE — Plan of Care (Signed)
  Problem: Education: Goal: Knowledge of [facility] Education information/materials will improve Outcome: Progressing   Problem: Safety: Goal: Periods of time without injury will increase Outcome: Progressing   Problem: Coping: Goal: Will verbalize feelings Outcome: Progressing  Patient oriented to the unit. Patient able to seek staff to discuss feelings. Patient remains safe and will continue to monitor.

## 2018-08-13 NOTE — Tx Team (Signed)
Interdisciplinary Treatment and Diagnostic Plan Update  08/13/2018 Time of Session:  Jessica Rasmussen MRN: [id number]  Principal Diagnosis: MDD (major depressive disorder), recurrent, severe, with psychosis (HCC)  Secondary Diagnoses: Principal Problem:   MDD (major depressive disorder), recurrent, severe, with psychosis (HCC) Active Problems:   Panic disorder   Generalized anxiety disorder   Current Medications:  Current Facility-Administered Medications  Medication Dose Route Frequency Provider Last Rate Last Dose  . acetaminophen (TYLENOL) tablet 650 mg  650 mg Oral Q6H PRN Maryagnes Amos, FNP   650 mg at 08/11/18 1330  . alum & mag hydroxide-simeth (MAALOX/MYLANTA) 200-200-20 MG/5ML suspension 30 mL  30 mL Oral Q4H PRN Starkes-Perry, Juel Burrow, FNP      . busPIRone (BUSPAR) tablet 5 mg  5 mg Oral TID Antonieta Pert, MD   5 mg at 08/13/18 0947  . hydrOXYzine (ATARAX/VISTARIL) tablet 10 mg  10 mg Oral Q4H PRN Antonieta Pert, MD   10 mg at 08/12/18 2207  . LORazepam (ATIVAN) tablet 0.5 mg  0.5 mg Oral Q8H PRN Money, Feliz Beam B, FNP      . magnesium hydroxide (MILK OF MAGNESIA) suspension 30 mL  30 mL Oral Daily PRN Rosario Adie, Juel Burrow, FNP      . metoprolol succinate (TOPROL-XL) 24 hr tablet 12.5 mg  12.5 mg Oral Daily Antonieta Pert, MD   12.5 mg at 08/13/18 0805  . sertraline (ZOLOFT) tablet 50 mg  50 mg Oral QHS Antonieta Pert, MD      . traZODone (DESYREL) tablet 50 mg  50 mg Oral QHS PRN Cobos, Rockey Situ, MD   50 mg at 08/12/18 2208   PTA Medications: No medications prior to admission.    Patient Stressors: Loss of close friend to cancer  Patient Strengths: Ability for insight Metallurgist fund of knowledge Motivation for treatment/growth Physical Health  Treatment Modalities: Medication Management, Group therapy, Case management,  1 to 1 session with clinician, Psychoeducation, Recreational  therapy.   Physician Treatment Plan for Primary Diagnosis: MDD (major depressive disorder), recurrent, severe, with psychosis (HCC) Long Term Goal(s): Improvement in symptoms so as ready for discharge Improvement in symptoms so as ready for discharge   Short Term Goals: Ability to identify changes in lifestyle to reduce recurrence of condition will improve Ability to maintain clinical measurements within normal limits will improve Ability to identify changes in lifestyle to reduce recurrence of condition will improve Ability to verbalize feelings will improve Ability to disclose and discuss suicidal ideas Ability to demonstrate self-control will improve Ability to identify and develop effective coping behaviors will improve Ability to maintain clinical measurements within normal limits will improve  Medication Management: Evaluate patient's response, side effects, and tolerance of medication regimen.  Therapeutic Interventions: 1 to 1 sessions, Unit Group sessions and Medication administration.  Evaluation of Outcomes: Progressing  Physician Treatment Plan for Secondary Diagnosis: Principal Problem:   MDD (major depressive disorder), recurrent, severe, with psychosis (HCC) Active Problems:   Panic disorder   Generalized anxiety disorder  Long Term Goal(s): Improvement in symptoms so as ready for discharge Improvement in symptoms so as ready for discharge   Short Term Goals: Ability to identify changes in lifestyle to reduce recurrence of condition will improve Ability to maintain clinical measurements within normal limits will improve Ability to identify changes in lifestyle to reduce recurrence of condition will improve Ability to verbalize feelings will improve Ability to disclose and discuss suicidal ideas Ability to demonstrate self-control  will improve Ability to identify and develop effective coping behaviors will improve Ability to maintain clinical measurements within  normal limits will improve     Medication Management: Evaluate patient's response, side effects, and tolerance of medication regimen.  Therapeutic Interventions: 1 to 1 sessions, Unit Group sessions and Medication administration.  Evaluation of Outcomes: Progressing   RN Treatment Plan for Primary Diagnosis: MDD (major depressive disorder), recurrent, severe, with psychosis (HCC) Long Term Goal(s): Knowledge of disease and therapeutic regimen to maintain health will improve  Short Term Goals: Ability to participate in decision making will improve, Ability to verbalize feelings will improve, Ability to disclose and discuss suicidal ideas and Ability to identify and develop effective coping behaviors will improve  Medication Management: RN will administer medications as ordered by provider, will assess and evaluate patient's response and provide education to patient for prescribed medication. RN will report any adverse and/or side effects to prescribing provider.  Therapeutic Interventions: 1 on 1 counseling sessions, Psychoeducation, Medication administration, Evaluate responses to treatment, Monitor vital signs and CBGs as ordered, Perform/monitor CIWA, COWS, AIMS and Fall Risk screenings as ordered, Perform wound care treatments as ordered.  Evaluation of Outcomes: Progressing   LCSW Treatment Plan for Primary Diagnosis: MDD (major depressive disorder), recurrent, severe, with psychosis (HCC) Long Term Goal(s): Safe transition to appropriate next level of care at discharge, Engage patient in therapeutic group addressing interpersonal concerns.  Short Term Goals: Engage patient in aftercare planning with referrals and resources  Therapeutic Interventions: Assess for all discharge needs, 1 to 1 time with Social worker, Explore available resources and support systems, Assess for adequacy in community support network, Educate family and significant other(s) on suicide prevention, Complete  Psychosocial Assessment, Interpersonal group therapy.  Evaluation of Outcomes: Progressing   Progress in Treatment: Attending groups: Yes. Participating in groups: Yes. Taking medication as prescribed: Yes. Toleration medication: Yes. Family/Significant other contact made: No, will contact:  patient declined consent Patient understands diagnosis: Yes. Discussing patient identified problems/goals with staff: Yes. Medical problems stabilized or resolved: Yes. Denies suicidal/homicidal ideation: Yes. Issues/concerns per patient self-inventory: No. Other:   New problem(s) identified: None   New Short Term/Long Term Goal(s): medication stabilization, elimination of SI thoughts, development of comprehensive mental wellness plan.    Patient Goals:  Learn coping skills   Discharge Plan or Barriers:  Patient plans to discharge home with her two children and follow up with Dr. Jackquline Berlin at [facility] for outpatient medication management services and [facility] for outpatient therapy services.   Reason for Continuation of Hospitalization: Anxiety Depression Medication stabilization  Estimated Length of Stay: 3-5 days   Attendees: Patient: 08/13/2018 10:55 AM  Physician: Dr. Nehemiah Massed, MD; Dr. Landry Mellow, MD 08/13/2018 10:55 AM  Nursing: Meriam Sprague.Kirtland Bouchard, RN; Casimiro Needle.Kathie Rhodes, RN 08/13/2018 10:55 AM  RN Care Manager: Onnie Boer, RN 08/13/2018 10:55 AM  Social Worker: Baldo Daub, LCSWA 08/13/2018 10:55 AM  Recreational Therapist: Juliann Pares 08/13/2018 10:55 AM  Other: Serena Colonel, NP 08/13/2018 10:55 AM  Other:  08/13/2018 10:55 AM  Other: 08/13/2018 10:55 AM    Scribe for Treatment Team: Maeola Sarah, LCSWA 08/13/2018 10:55 AM

## 2018-08-13 NOTE — [facility] Group Notes (Signed)
LCSW Group Therapy Note 08/13/2018 11:43 AM  Type of Therapy/Topic: Group Therapy: Emotion Regulation  Participation Level: Active   Description of Group:  The purpose of this group is to assist patients in learning to regulate negative emotions and experience positive emotions. Patients will be guided to discuss ways in which they have been vulnerable to their negative emotions. These vulnerabilities will be juxtaposed with experiences of positive emotions or situations, and patients will be challenged to use positive emotions to combat negative ones. Special emphasis will be placed on coping with negative emotions in conflict situations, and patients will process healthy conflict resolution skills.  Therapeutic Goals: 1. Patient will identify two positive emotions or experiences to reflect on in order to balance out negative emotions 2. Patient will label two or more emotions that they find the most difficult to experience 3. Patient will demonstrate positive conflict resolution skills through discussion and/or role plays  Summary of Patient Progress:  Jessica Rasmussen was engaged and participated throughout the group session. Jessica Rasmussen reports she struggles with sadness and anxiety. Jessica Rasmussen states that she wants to learn better coping skills.    Therapeutic Modalities:  Cognitive Behavioral Therapy Feelings Identification Dialectical Behavioral Therapy   Alcario Drought Clinical Social Worker

## 2018-08-13 NOTE — Progress Notes (Signed)
Recreation Therapy Notes  Date: 10.25.19 Time: 0930 Location: [address] Dayroom  Group Topic: Stress Management  Goal Area(s) Addresses:  Patient will verbalize importance of using healthy stress management.  Patient will identify positive emotions associated with healthy stress management.   Intervention: Stress Management  Activity :  Meditation. LRT introduced the stress management technique of meditation.  LRT played a meditation for patients to engage in a meditation on gratitude.  Patients were to listen and follow along as the meditation was played.  Education:  Stress Management, Discharge Planning.   Education Outcome: Acknowledges edcuation/In group clarification offered/Needs additional education  Clinical Observations/Feedback:  Pt did not attend group.    Caroll Rancher, LRT/CTRS         Caroll Rancher A 08/13/2018 11:47 AM

## 2018-08-13 NOTE — Plan of Care (Signed)
Progress Note  D: pt found in the dayroom; compliant with medication administration. Pt states she slept well. Pt rates her depression/hopelessness/anxiety a 0/0/6 out of 10 respectively. Pt denies any physical symptoms or pain, rating this a 0/10. Pt states her goal for today is to work on her anxiety and concentration. Pt states she achieve this by speaking with the doctor and working on her breathing techniques. Pt denies any si/hi/ah/vh and verbally agrees to approach staff if these become apparent or before harming herself while at [facility]. Pt may d/c tomorrow pending her new medication order.  A: pt provided support and encouragement. Pt given medications per protocol and standing orders. Q57m safety checks implemented and continued. R: pt safe on the unit. Will continue to monitor.  Pt progressing in the following metrics  Problem: Activity: Goal: Interest or engagement in activities will improve Outcome: Progressing Goal: Sleeping patterns will improve Outcome: Progressing   Problem: Coping: Goal: Ability to verbalize frustrations and anger appropriately will improve Outcome: Progressing Goal: Ability to demonstrate self-control will improve Outcome: Progressing   Problem: Health Behavior/Discharge Planning: Goal: Identification of resources available to assist in meeting health care needs will improve Outcome: Progressing Goal: Compliance with treatment plan for underlying cause of condition will improve Outcome: Progressing   Problem: Physical Regulation: Goal: Ability to maintain clinical measurements within normal limits will improve Outcome: Progressing

## 2018-08-14 MED ORDER — TRAZODONE HCL 50 MG PO TABS: 50.0000 mg | ORAL_TABLET | Freq: Every evening | ORAL | 0 refills | Status: AC | PRN

## 2018-08-14 MED ORDER — METOPROLOL SUCCINATE ER 25 MG PO TB24: 12.5000 mg | ORAL_TABLET | Freq: Every day | ORAL | 0 refills | Status: AC

## 2018-08-14 MED ORDER — BUSPIRONE HCL 5 MG PO TABS: 5.0000 mg | ORAL_TABLET | Freq: Three times a day (TID) | ORAL | 0 refills | Status: AC

## 2018-08-14 MED ORDER — SERTRALINE HCL 50 MG PO TABS: 50.0000 mg | ORAL_TABLET | Freq: Every day | ORAL | 0 refills | Status: AC

## 2018-08-14 MED ORDER — HYDROXYZINE HCL 10 MG PO TABS: 10.0000 mg | ORAL_TABLET | ORAL | 0 refills | Status: AC | PRN

## 2018-08-14 NOTE — [facility] Suicide Risk Assessment (Signed)
[facility] Discharge Suicide Risk Assessment   Principal Problem: MDD (major depressive disorder), recurrent, severe, with psychosis (HCC) Discharge Diagnoses:  Patient Active Problem List   Diagnosis Date Noted  . Panic disorder [F41.0]   . Generalized anxiety disorder [F41.1]   . MDD (major depressive disorder), recurrent, severe, with psychosis (HCC) [F33.3] 08/08/2018  . Migraine, unspecified, without mention of intractable migraine without mention of status migrainosus [G43.909] 12/22/2013  . Alteration of consciousness [R40.4] 12/22/2013    Total Time spent with patient: 15 minutes  Musculoskeletal: Strength & Muscle Tone: within normal limits Gait & Station: normal Patient leans: N/A  Psychiatric Specialty Exam: Review of Systems  All other systems reviewed and are negative.   Blood pressure 116/84, pulse 82, temperature 98.2 F (36.8 C), temperature source Oral, resp. rate 20, height 5\' 5"  (1.651 m), weight 95.3 kg, last menstrual period 07/31/2018.Body mass index is 34.95 kg/m.  General Appearance: Casual  Eye Contact::  Good  Speech:  Normal Rate409  Volume:  Normal  Mood:  Euthymic  Affect:  Congruent  Thought Process:  Coherent and Descriptions of Associations: Intact  Orientation:  Full (Time, Place, and Person)  Thought Content:  Logical  Suicidal Thoughts:  No  Homicidal Thoughts:  No  Memory:  Immediate;   Fair Recent;   Fair Remote;   Fair  Judgement:  Intact  Insight:  Fair  Psychomotor Activity:  Normal  Concentration:  Good  Recall:  Good  Fund of Knowledge:Good  Language: Good  Akathisia:  Negative  Handed:  Right  AIMS (if indicated):     Assets:  Communication Skills Desire for Improvement Financial Resources/Insurance Housing Leisure Time Physical Health Resilience Social Support Talents/Skills  Sleep:  Number of Hours: 6.5  Cognition: WNL  ADL's:  Intact   Mental Status Per Nursing Assessment::   On Admission:  NA  Demographic  Factors:  NA  Loss Factors: NA  Historical Factors: Impulsivity  Risk Reduction Factors:   Responsible for children under 66 years of age, Sense of responsibility to family, Positive social support and Positive coping skills or problem solving skills  Continued Clinical Symptoms:  Severe Anxiety and/or Agitation Panic Attacks Depression:   Impulsivity  Cognitive Features That Contribute To Risk:  None    Suicide Risk:  Minimal: No identifiable suicidal ideation.  Patients presenting with no risk factors but with morbid ruminations; may be classified as minimal risk based on the severity of the depressive symptoms  Follow-up Information    [facility]. Go on 08/18/2018.   Why:  Appointment for medication management is Thursday, 08/18/18 at 3:00pm with Dr. Jackquline Berlin. Please be sure to bring your Photo ID, SSN, any insurance cards and any discharge paperwork from this hospitalization including your list of medications.  Contact information: [address], [address], [place] [address]  [phone] Fax: [phone]       [facility] Call.   Why:  Referral made by CSW on 08/10/18. Please call within 24 hours after discharge to secure appointment time and date. Be ready to provide a $20 deposit to secure your appointment.  Contact information: [address] [place] [address] [phone]           Plan Of Care/Follow-up recommendations:  Activity:  ad lib  Antonieta Pert, MD 08/14/2018, 7:55 AM

## 2018-08-14 NOTE — Progress Notes (Signed)
The patient rated her day as an 8 out of a possible 10. She did however state that she was disappointed over not being discharged but that she understood that it was due in part to being prescribed new medication. Her goal for tomorrow is to go home.
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
## 2018-08-14 NOTE — Progress Notes (Signed)
  [facility] Adult Case Management Discharge Plan :  Will you be returning to the same living situation after discharge:  Yes,  alone At discharge, do you have transportation home?: Yes,  car is in parking lot Do you have the ability to pay for your medications: Yes,  denies barriers  Release of information consent forms completed and turned in to Medical Records by CSW.     Patient to Follow up at: Follow-up Information    [facility]. Go on 08/18/2018.   Why:  Appointment for medication management is Thursday, 08/18/18 at 3:00pm with Dr. Jackquline Berlin. Please be sure to bring your Photo ID, SSN, any insurance cards and any discharge paperwork from this hospitalization including your list of medications.  Contact information: [address], [address], [place] [address]  [phone] Fax: [phone]       [facility] Call.   Why:  Referral made by CSW on 08/10/18. Please call within 24 hours after discharge to secure appointment time and date. Be ready to provide a $20 deposit to secure your appointment.  Contact information: [address] [place] [address] [phone]           Next level of care provider has access to [facility] Link:no  Safety Planning and Suicide Prevention discussed: Yes,  with patient  Have you used any form of tobacco in the last 30 days? (Cigarettes, Smokeless Tobacco, Cigars, and/or Pipes): No  Has patient been referred to the Quitline?: N/A patient is not a smoker  Patient has been referred for addiction treatment: N/A  Lynnell Chad, LCSW 08/14/2018, 9:15 AM

## 2018-08-14 NOTE — Plan of Care (Signed)
D: Pt A & O X 3. Denies SI, HI, AVH and pain at this time. She reports not feeling depressed at this time. She slept well last night, and received sleep medication which was helpful. Her appetite is good, energy normal and concentration poor. She rates her depression 0/10 and anxiety 5/10. She complains of feeling lightheaded, but not dizzy. Her vitals are WNL. Goal: "Remember to breathe, stay ahead of racing thoughts, anxiety" and "breathe, tell myself I got this and stay calm." D/C home as ordered. Patient has her vehicle here and will be driving herself home. A: D/C instructions reviewed with pt including prescriptions and follow up appointment, compliance encouraged. All belongings from locker #[id number] given to pt at time of departure. Scheduled and PRN medications given with verbal education and effects monitored. Safety checks maintained without incident till time of d/c.  R: Pt receptive to care. Compliant with medications when offered. Denies adverse drug reactions when assessed. Verbalized understanding related to d/c instructions. Signed belonging sheet in agreement with items received from locker. Ambulatory with a steady gait. Appears to be in no physical distress at time of departure.

## 2018-08-14 NOTE — Progress Notes (Signed)
D: Patient observed up and visible in the milieu. Patient states, "I'm really hopeful I can leave tomorrow. I feel ready. I was upset I didn't go today." Patient's affect anxious, appropriate with congruent mood. Denies pain, physical complaints.   A: Medicated per orders, no prns needed or requested. Medication education provided. Level III obs in place for safety. Emotional support offered. Patient encouraged to complete Suicide Safety Plan before discharge. Encouraged to attend and participate in unit programming.   R: Patient verbalizes understanding of POC. Patient denies SI/HI/AVH and remains safe on level III obs. Will continue to monitor throughout the night.

## 2018-08-14 NOTE — [facility] Suicide Risk Assessment (Signed)
[facility] INPATIENT:  Family/Significant Other Suicide Prevention Education  Suicide Prevention Education:  Patient Refusal for Family/Significant Other Suicide Prevention Education: The patient Jessica Rasmussen has refused to provide written consent for family/significant other to be provided Family/Significant Other Suicide Prevention Education during admission and/or prior to discharge.  Physician notified.  Suicide Prevention Education was reviewed thoroughly with patient, including risk factors, warning signs, and what to do.  Mobile Crisis services were described and that telephone number pointed out, with encouragement to patient to put this number in personal cell phone.  Brochure was provided to patient to share with natural supports.  Patient acknowledged the ways in which they are at risk, and how working through each of their issues can gradually start to reduce their risk factors.  Patient was encouraged to think of the information in the context of people in their own lives.  Patient denied having access to firearms  Patient verbalized understanding of information provided.  Patient endorsed a desire to live.      Carloyn Jaeger Grossman-Orr 08/14/2018, 9:31 AM

## 2018-08-14 NOTE — Progress Notes (Signed)
Adult Psychoeducational Group Note  Date:  08/14/2018 Time:  8:30AM-9:15AM  Group Topic/Focus:  Goals Group:   The focus of this group is to help patients establish daily goals to achieve during treatment and discuss how the patient can incorporate goal setting into their daily lives to aide in recovery.  Participation Level:  Active  Participation Quality:  Attentive  Affect:  Appropriate  Cognitive:  Alert  Insight: Good  Engagement in Group:  Engaged  Modes of Intervention:  Discussion  Additional Comments: Patient informed group that her goal was to stay calm in the car. MHT challenged patient to think through how she plans to remain calm. Patient informed group that she plans to stay in the house. MHT helped patient to consider additional steps she could take to stay calm including finding someone to ride with her, calling someone while driving, listening to music, or simply finding a place to stop if she begins to feel overwhelmed by her drive. Patient was receptive of these recommendations. MHT challenged patient to consider something she has learned since she has been in the hospital. Patient informed group that she is not alone. MHT reminded group that they are not the only people struggling with their problems and that they can seek support in each other during their treatment as well as in their community.   Annye Asa 08/14/2018, 2:14 PM

## 2018-08-14 NOTE — [facility] Group Notes (Signed)
[facility] Group Notes: (Clinical Social Work)   08/14/2018      Type of Therapy:  Group Therapy   Participation Level:  Did Not Attend despite MHT prompting   Wynema Garoutte Grossman-Orr, LCSW 08/14/2018, 11:27 AM     

## 2019-11-02 IMAGING — CR DG CHEST 2V
2 series · 2 of 2 positions shown · non-contrast
Comparison: None.

CLINICAL DATA: 32-year-old female with a history of shortness of
breath

EXAM:
CHEST - 2 VIEW

[w chest pa]
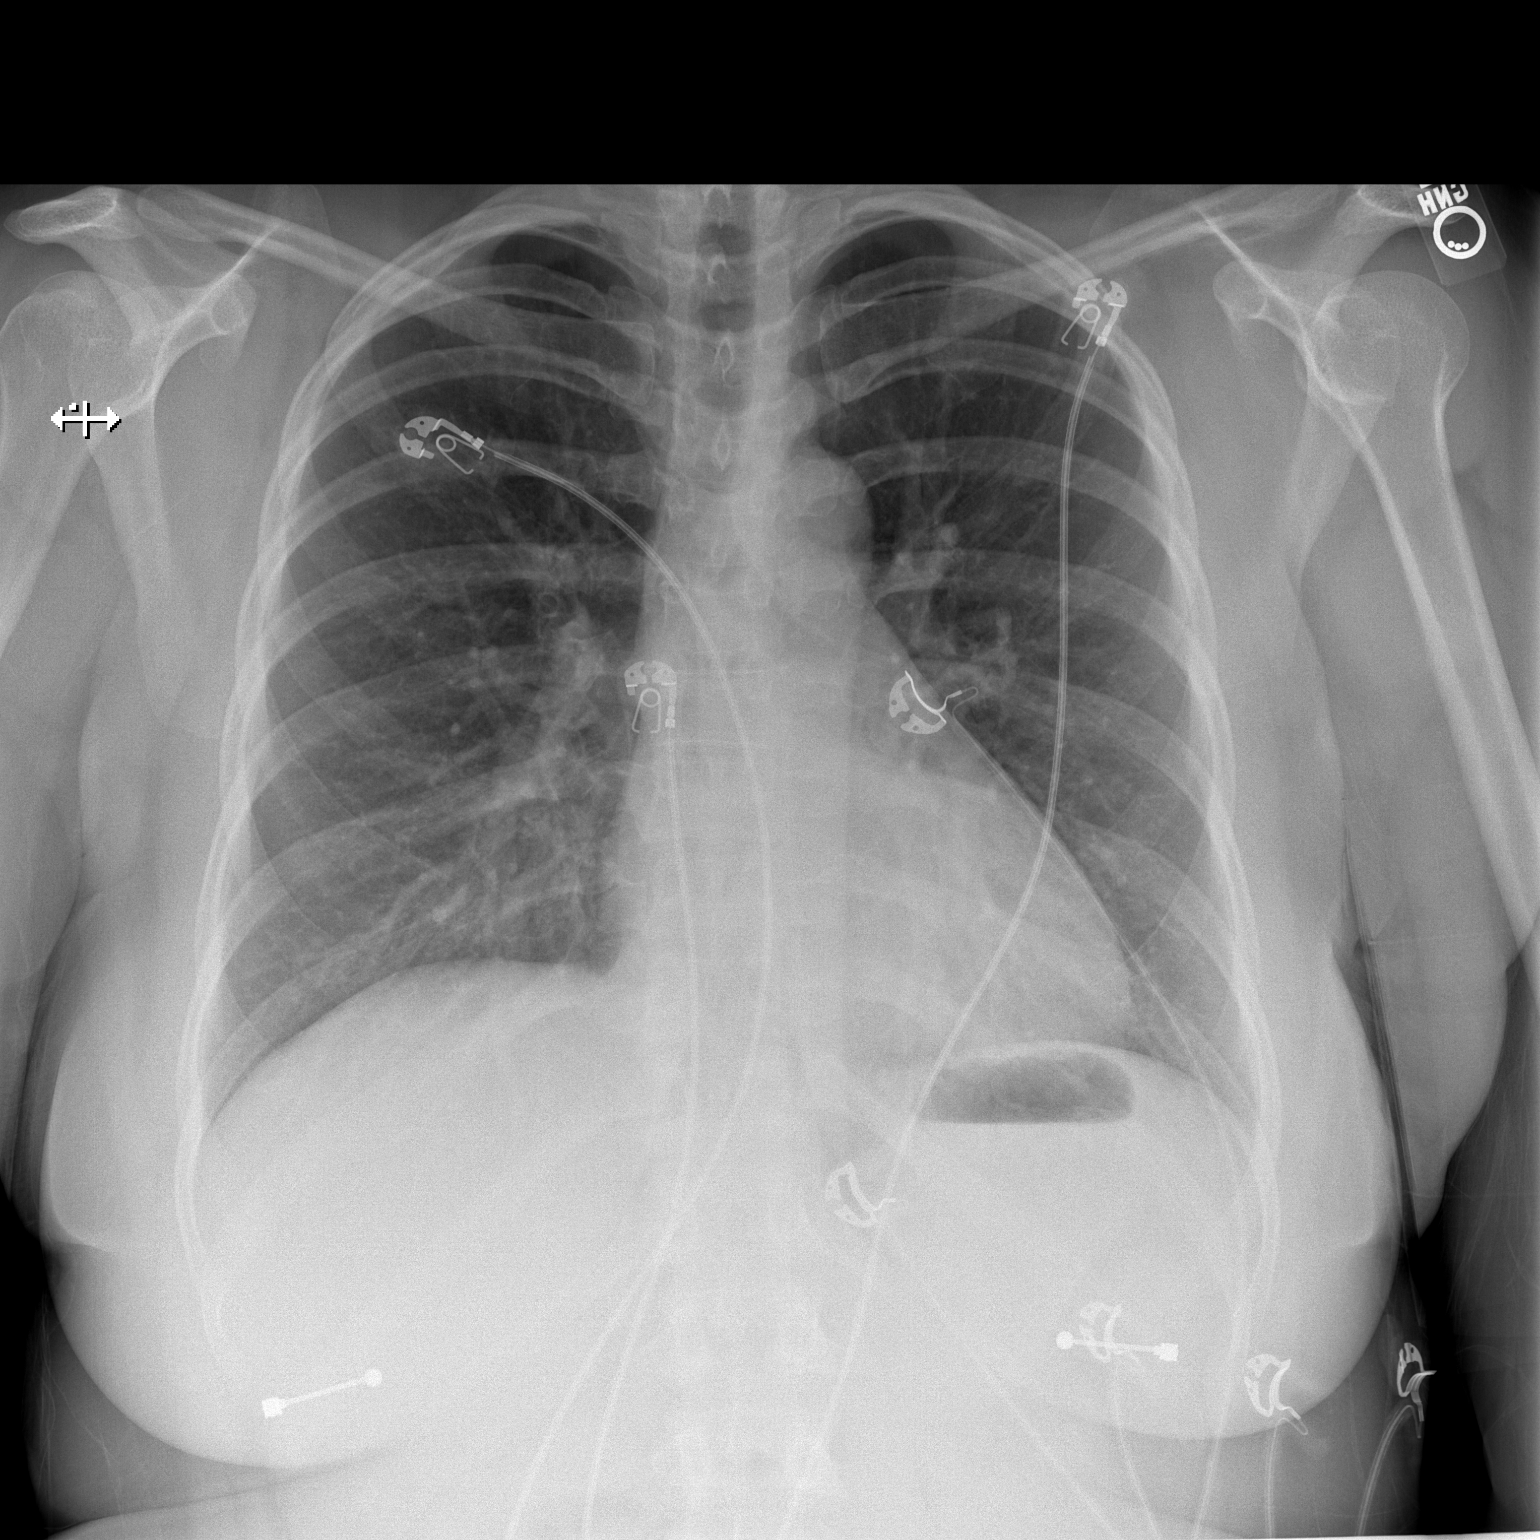

[w chest lat]
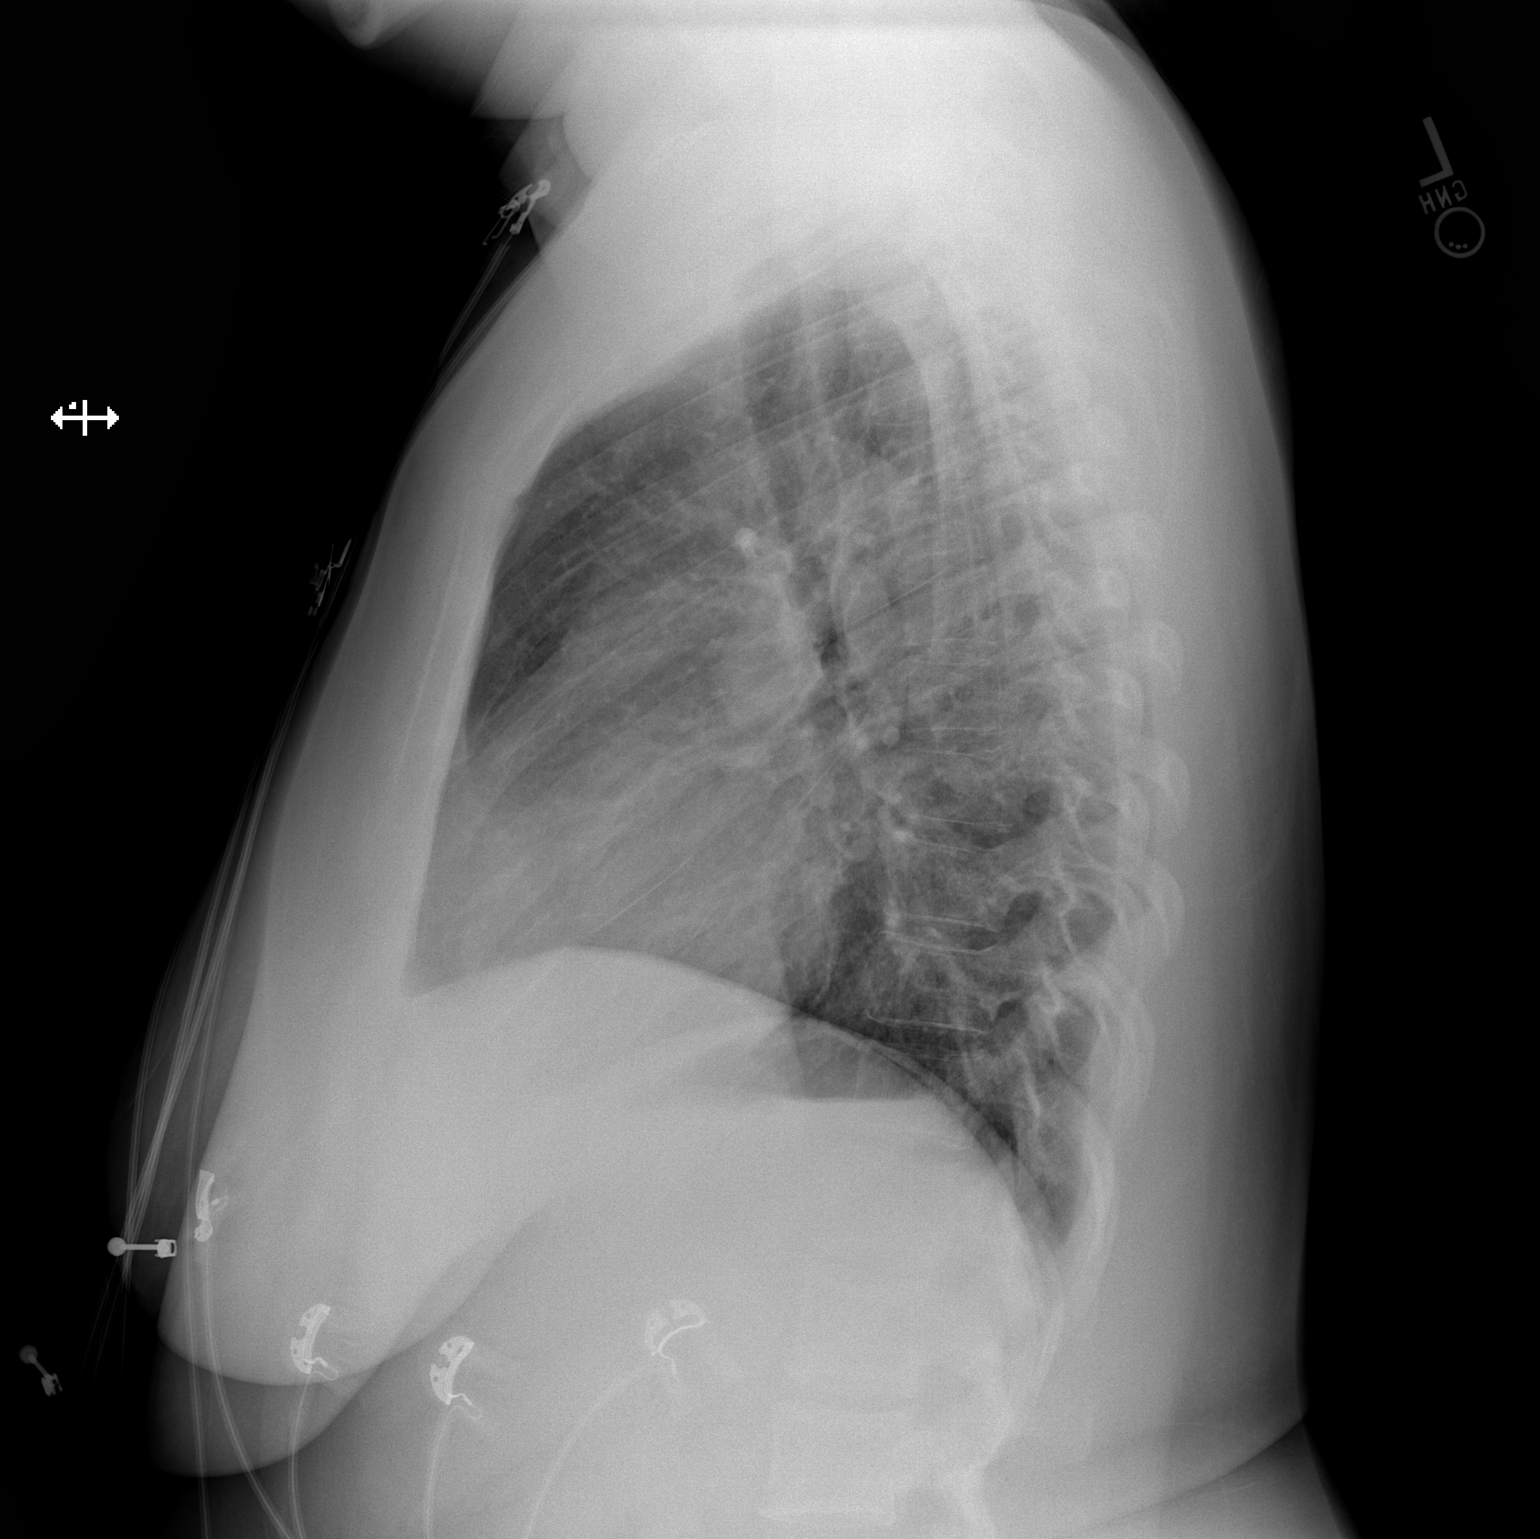

[2 of 2 positions shown; findings below may reference images not displayed]

FINDINGS: The heart size and mediastinal contours are within normal limits.
Both lungs are clear. The visualized skeletal structures are
unremarkable.
IMPRESSION: No radiographic evidence of acute cardiopulmonary disease
